# Patient Record
Sex: Female | Born: 1980 | Race: White | Hispanic: No | Marital: Married | Smoking: Current every day smoker
Health system: Southern US, Community
[De-identification: ages and names within clinical notes are randomized; demographics above are authoritative.]

## PROBLEM LIST (undated history)

## (undated) ENCOUNTER — Inpatient Hospital Stay (HOSPITAL_COMMUNITY): Payer: Self-pay

## (undated) DIAGNOSIS — Z1589 Genetic susceptibility to other disease: Secondary | ICD-10-CM

## (undated) DIAGNOSIS — E7212 Methylenetetrahydrofolate reductase deficiency: Secondary | ICD-10-CM

## (undated) DIAGNOSIS — Z8759 Personal history of other complications of pregnancy, childbirth and the puerperium: Secondary | ICD-10-CM

## (undated) DIAGNOSIS — O99280 Endocrine, nutritional and metabolic diseases complicating pregnancy, unspecified trimester: Secondary | ICD-10-CM

## (undated) HISTORY — PX: UTERINE SEPTUM RESECTION: SHX5386

---

## 2010-07-22 ENCOUNTER — Ambulatory Visit (HOSPITAL_COMMUNITY): Admission: RE | Admit: 2010-07-22 | Discharge: 2010-07-22 | Payer: Self-pay | Admitting: Obstetrics & Gynecology

## 2010-07-24 ENCOUNTER — Inpatient Hospital Stay (HOSPITAL_COMMUNITY): Admission: AD | Admit: 2010-07-24 | Discharge: 2010-07-24 | Payer: Self-pay | Admitting: Obstetrics and Gynecology

## 2010-07-28 DEATH — deceased

## 2010-09-27 HISTORY — PX: DILATION AND CURETTAGE OF UTERUS: SHX78

## 2010-12-09 LAB — CBC
HCT: 35.5 % — ABNORMAL LOW (ref 36.0–46.0)
MCV: 94.3 fL (ref 78.0–100.0)
RDW: 12.5 % (ref 11.5–15.5)
WBC: 8.3 10*3/uL (ref 4.0–10.5)

## 2011-04-01 LAB — OB RESULTS CONSOLE ANTIBODY SCREEN: Antibody Screen: NEGATIVE

## 2011-09-01 LAB — OB RESULTS CONSOLE ABO/RH

## 2011-09-28 NOTE — L&D Delivery Note (Signed)
Operative Delivery Note At 4:15 PM a viable and healthy female was delivered via Vaginal, Vacuum Investment banker, operational).  Presentation: vertex; Position: Right,, Occiput,, Anterior; Station: +3.  Verbal consent: obtained from patient.  Risks and benefits discussed in detail.  Risks include, but are not limited to the risks of anesthesia, bleeding, infection, damage to maternal tissues, fetal cephalhematoma.  There is also the risk of inability to effect vaginal delivery of the head, or shoulder dystocia that cannot be resolved by established maneuvers, leading to the need for emergency cesarean section. 2nd to terminal bradycardia( FHR 70's)  APGAR: 6, 8; weight 8 lb 5.5 oz (3785 g).   Placenta status: Intact, Spontaneous.   Cord: 3 vessels with the following complications: None. Cord around body.  Cord pH: none Anesthesia: None  Instruments:Kiwi , mushroom. Pop off x 1 Episiotomy: None Lacerations: 2nd degree Suture Repair: 3.0 chromic Est. Blood Loss (mL): 300  Mom to postpartum.  Baby to nursery-stable.  Riverlyn Kizziah A 04/14/2012, 6:16 PM

## 2011-10-29 ENCOUNTER — Other Ambulatory Visit: Payer: Self-pay

## 2011-11-10 ENCOUNTER — Other Ambulatory Visit (HOSPITAL_COMMUNITY): Payer: Self-pay | Admitting: Obstetrics and Gynecology

## 2011-11-10 DIAGNOSIS — O280 Abnormal hematological finding on antenatal screening of mother: Secondary | ICD-10-CM

## 2011-11-16 ENCOUNTER — Ambulatory Visit (HOSPITAL_COMMUNITY)
Admission: RE | Admit: 2011-11-16 | Discharge: 2011-11-16 | Disposition: A | Discharge status: Home / Self Care | Payer: BC Managed Care – PPO | Source: Ambulatory Visit | Attending: Obstetrics and Gynecology | Admitting: Obstetrics and Gynecology

## 2011-11-16 ENCOUNTER — Encounter (HOSPITAL_COMMUNITY): Payer: Self-pay

## 2011-11-16 DIAGNOSIS — O358XX0 Maternal care for other (suspected) fetal abnormality and damage, not applicable or unspecified: Secondary | ICD-10-CM | POA: Insufficient documentation

## 2011-11-16 DIAGNOSIS — Z1389 Encounter for screening for other disorder: Secondary | ICD-10-CM | POA: Insufficient documentation

## 2011-11-16 DIAGNOSIS — O280 Abnormal hematological finding on antenatal screening of mother: Secondary | ICD-10-CM

## 2011-11-16 DIAGNOSIS — O99330 Smoking (tobacco) complicating pregnancy, unspecified trimester: Secondary | ICD-10-CM | POA: Insufficient documentation

## 2011-11-16 DIAGNOSIS — Z363 Encounter for antenatal screening for malformations: Secondary | ICD-10-CM | POA: Insufficient documentation

## 2011-11-16 DIAGNOSIS — O289 Unspecified abnormal findings on antenatal screening of mother: Secondary | ICD-10-CM | POA: Insufficient documentation

## 2011-11-16 HISTORY — DX: Methylenetetrahydrofolate reductase deficiency: E72.12

## 2011-11-16 HISTORY — DX: Genetic susceptibility to other disease: Z15.89

## 2011-11-16 NOTE — Progress Notes (Signed)
Genetic Counseling  High-Risk Gestation Note  Appointment Date:  11/16/2011 Referred By: Valerie Conley, * Date of Birth:  1981-01-01 Partner: Valerie Conley, Jr.     Pregnancy History: G3P0020 Estimated Date of Delivery: 04/14/12 Estimated Gestational Age: [redacted]w[redacted]d Attending: Particia Nearing, MD   Valerie Conley and her husband, Mr. Valerie Conley. Conley, were seen for genetic counseling because of an increased risk for fetal Down syndrome based on Quad screening performed through [facility].  They were counseled regarding the Quad screen result and the associated 1 in 205 risk for fetal Down syndrome.  We reviewed chromosomes, nondisjunction, and the common features and variable prognosis of Down syndrome.  In addition, we reviewed the screen negative risks for trisomy 18 and ONTDs.  We also discussed other explanations for a screen positive result including: differences in maternal metabolism and normal variation.  We reviewed other available screening and diagnostic options including detailed ultrasound and amniocentesis.  We discussed the risks, limitations, and benefits of each. We discussed another type of screening test, noninvasive prenatal testing (NIPT), which utilizes cell free fetal DNA found in the maternal circulation. This test is not diagnostic for chromosome conditions, but can provide information regarding the presence or absence of extra fetal DNA for chromosomes 13, 18 and 21. Thus, it would not identify or rule out all fetal aneuploidy. The reported detection rate is greater than 99% for Trisomy 21, greater than 97% for Trisomy 18, and is approximately 80% (8 out of 10) for Trisomy 13. The false positive rate is thought to be less than 1% for any of these conditions.   After thoughtful consideration of these options, Valerie Conley elected to have ultrasound, but declined amniocentesis and cell free fetal DNA testing.  They understand that ultrasound cannot rule out all birth defects or  genetic syndromes.  The patient was advised of this limitation and states she still does not want diagnostic testing at this time or in the future given the associated risk of complications.   However, they were counseled that 50-80% of fetuses with Down syndrome, when well visualized, have detectable anomalies or soft markers by ultrasound.  A complete ultrasound was performed today.  The ultrasound report will be sent under separate cover.   Valerie Conley was provided with written information regarding cystic fibrosis (CF) including the carrier frequency and incidence in the Caucasian population, the availability of carrier testing and prenatal diagnosis if indicated.  In addition, we discussed that CF is routinely screened for as part of the [place] newborn screening panel.  She declined testing today.   Both family histories were reviewed and found to be contributory for the patient's sister with a history of Addison disease and seizures, diagnosed at age 57 years. She is reportedly legally blind and is otherwise healthy at age 83 years old. We discussed that without additional information regarding an underlying cause, recurrence risk for relatives cannot be accurately assessed. The family history was noncontributory for birth defects, mental retardation, and known genetic conditions. Without further information regarding the provided family history, an accurate genetic risk cannot be calculated. Further genetic counseling is warranted if more information is obtained.  Valerie Conley denied exposure to environmental toxins or chemical agents. She denied the use of street drugs. She reported smoking approximately 2 cigarettes per day. The associations of smoking in pregnancy were reviewed and cessation encouraged. She reported drinking alcohol one night before knowing about the pregnancy. The all-or-none period was discussed, meaning exposures that occur  in the first 4 weeks of gestation are typically  thought to either not affect the pregnancy at all or result in a miscarriage. She denied significant viral illnesses during the course of her pregnancy. Her medical and surgical histories were contributory for uterine septum surgical repair.   I counseled this couple for approximately 35 minutes regarding the above risks and available options.     Quinn Plowman, MS,  Certified Genetic Counselor 11/16/2011

## 2012-02-27 ENCOUNTER — Inpatient Hospital Stay (HOSPITAL_COMMUNITY)
Admission: AD | Admit: 2012-02-27 | Discharge: 2012-02-27 | Disposition: A | Discharge status: Home / Self Care | Payer: BC Managed Care – PPO | Source: Ambulatory Visit | Attending: Obstetrics & Gynecology | Admitting: Obstetrics & Gynecology

## 2012-02-27 ENCOUNTER — Encounter (HOSPITAL_COMMUNITY): Payer: Self-pay | Admitting: Obstetrics and Gynecology

## 2012-02-27 DIAGNOSIS — O99891 Other specified diseases and conditions complicating pregnancy: Secondary | ICD-10-CM | POA: Insufficient documentation

## 2012-02-27 DIAGNOSIS — O228X9 Other venous complications in pregnancy, unspecified trimester: Secondary | ICD-10-CM | POA: Insufficient documentation

## 2012-02-27 DIAGNOSIS — K5289 Other specified noninfective gastroenteritis and colitis: Secondary | ICD-10-CM | POA: Insufficient documentation

## 2012-02-27 DIAGNOSIS — K649 Unspecified hemorrhoids: Secondary | ICD-10-CM | POA: Insufficient documentation

## 2012-02-27 DIAGNOSIS — O212 Late vomiting of pregnancy: Secondary | ICD-10-CM | POA: Insufficient documentation

## 2012-02-27 DIAGNOSIS — R197 Diarrhea, unspecified: Secondary | ICD-10-CM | POA: Insufficient documentation

## 2012-02-27 HISTORY — DX: Personal history of other complications of pregnancy, childbirth and the puerperium: Z87.59

## 2012-02-27 HISTORY — DX: Methylenetetrahydrofolate reductase deficiency: O99.280

## 2012-02-27 HISTORY — DX: Methylenetetrahydrofolate reductase deficiency: E72.12

## 2012-02-27 LAB — URINALYSIS, ROUTINE W REFLEX MICROSCOPIC
Glucose, UA: NEGATIVE mg/dL
Hgb urine dipstick: NEGATIVE
Protein, ur: NEGATIVE mg/dL
pH: 6 (ref 5.0–8.0)

## 2012-02-27 MED ORDER — LOPERAMIDE HCL 2 MG PO CAPS
4.0000 mg | ORAL_CAPSULE | ORAL | Status: DC | PRN
  Filled 2012-02-27: qty 2

## 2012-02-27 MED ORDER — ONDANSETRON HCL 4 MG/2ML IJ SOLN: 4.0000 mg | Freq: Once | INTRAMUSCULAR | Status: DC

## 2012-02-27 MED ORDER — DEXTROSE IN LACTATED RINGERS 5 % IV SOLN
INTRAVENOUS | Status: DC
  Administered 2012-02-27: 999 mL/h via INTRAVENOUS
  Administered 2012-02-27: 500 mL/h via INTRAVENOUS

## 2012-02-27 MED ORDER — ONDANSETRON 8 MG/NS 50 ML IVPB
8.0000 mg | Freq: Once | INTRAVENOUS | Status: AC
  Administered 2012-02-27: 8 mg via INTRAVENOUS
  Filled 2012-02-27: qty 8

## 2012-02-27 MED ORDER — FAMOTIDINE IN NACL 20-0.9 MG/50ML-% IV SOLN
20.0000 mg | Freq: Once | INTRAVENOUS | Status: AC
  Administered 2012-02-27: 20 mg via INTRAVENOUS
  Filled 2012-02-27: qty 50

## 2012-02-27 NOTE — Discharge Instructions (Signed)
If you get home and are unable to hold down anything, having continuously vomiting or continuing to have diarrhea, please call [facility] OB[facility]GYN office.

## 2012-02-27 NOTE — MAU Note (Signed)
"  My nephew has a stomach bug and threw up on me Friday at lunch.  Everything started at about 2330 last night (vomiting & diarrhea).  The last time I threw up was about 0230 this morning; which is the last time I've tried to drink anything.  I had some ice chips on the way down here and it made my stomach burn.  The baby seems to be moving really good though.  The diarrhea still continues and I am still nauseated."

## 2012-02-27 NOTE — MAU Provider Note (Signed)
  History   CSN: [id number] Arrival date and time: 02/27/12 6578   Chief Complaint  Patient presents with  . Emesis During Pregnancy  . Diarrhea   HPI G3P0, at 33.2/7 wks, here for acute gastroenteritis after sick contact with her nephew few days back (his parents got sick a day after him). C/o vomiting and diarrhea since yesterday and not able to keep any fluids/food down, feels weak, tired.  Has hemorrhoids hx, not prolapsed right now.  Good FMs, no contractions/ bleeding/ loss of fluid or any Ob complaints.  Good PNCare (sees Dr Cherly Hensen)   Past Medical History  Diagnosis Date  . MTHFR mutation   . MTHFR deficiency complicating pregnancy   . History of miscarriage    Past Surgical History  Procedure Date  . Dilation and curettage of uterus 2012    with 2nd SAB   Family History  Problem Relation Age of Onset  . Anesthesia problems Neg Hx   . Hypotension Neg Hx   . Malignant hyperthermia Neg Hx   . Pseudochol deficiency Neg Hx    History  Substance Use Topics  . Smoking status: Former Smoker    Types: Cigarettes    Quit date: 07/30/2011  . Smokeless tobacco: Not on file  . Alcohol Use: No   Allergies:  Allergies  Allergen Reactions  . Penicillins Nausea And Vomiting and Rash   Prescriptions prior to admission  Medication Sig Dispense Refill  . calcium carbonate (TUMS - DOSED IN MG ELEMENTAL CALCIUM) 500 MG chewable tablet Chew 3 tablets by mouth daily. Takes for heartburn      . Cyanocobalamin (VITAMIN B-12 PO) Take 1 tablet by mouth daily.       . folic acid (FOLVITE) 1 MG tablet Take 4 mg by mouth daily.      . Prenatal Vit-Fe Fumarate-FA (PRENATAL MULTIVITAMIN) TABS Take 1 tablet by mouth every morning.      . pyridOXINE (B-6) 50 MG tablet Take 25 mg by mouth 3 (three) times daily.       ROS  Denies chest pain/SOB/ dizziness/ HAs. Physical Exam   Blood pressure 107/54, pulse 89, temperature 97.6 F (36.4 C), temperature source Oral, resp. rate 18, height 5'  7.5" (1.715 m), weight 85.639 kg (188 lb 12.8 oz), last menstrual period 07/09/2011.  Physical Exam A&O x 3, no acute distress, but appears tired. Pleasant Lungs CTA bilat CV RRR, no tachycardia Abdo soft, non tender, non acute, relaxed. No palpable contractions Extr no edema/ tenderness Pelvic deferred since no contractions or pressure or bleeding FHT 155/ + accels/ no decels/ moderate variab- NST reactive Toco none  MAU Course  Procedures IV D5LR 1 lit Zofran 8 mg IV Famotidine 20 mg IV  Assessment and Plan  Acute gastroenteritis, likely viral from sick contract IVFluids. PO tolerated without further vomiting.  BRAT diet reviewed Hemorrhoids mngmt F/up office in 1 wk, sooner if not better, PTL precautions, FAC.  Allanna Bresee R 02/27/2012, 12:40 PM

## 2012-03-15 LAB — OB RESULTS CONSOLE GBS: GBS: NEGATIVE

## 2012-04-14 ENCOUNTER — Inpatient Hospital Stay (HOSPITAL_COMMUNITY)
Admission: AD | Admit: 2012-04-14 | Discharge: 2012-04-16 | DRG: 373 | Disposition: A | Discharge status: Home / Self Care | Payer: BC Managed Care – PPO | Source: Ambulatory Visit | Attending: Obstetrics and Gynecology | Admitting: Obstetrics and Gynecology

## 2012-04-14 ENCOUNTER — Encounter (HOSPITAL_COMMUNITY): Payer: Self-pay | Admitting: *Deleted

## 2012-04-14 DIAGNOSIS — O878 Other venous complications in the puerperium: Secondary | ICD-10-CM | POA: Diagnosis present

## 2012-04-14 DIAGNOSIS — O09529 Supervision of elderly multigravida, unspecified trimester: Secondary | ICD-10-CM | POA: Diagnosis present

## 2012-04-14 DIAGNOSIS — K649 Unspecified hemorrhoids: Secondary | ICD-10-CM | POA: Diagnosis present

## 2012-04-14 LAB — TYPE AND SCREEN
ABO/RH(D): A POS
Antibody Screen: NEGATIVE

## 2012-04-14 LAB — CBC
Hemoglobin: 13.3 g/dL (ref 12.0–15.0)
MCHC: 34.9 g/dL (ref 30.0–36.0)
RDW: 12.9 % (ref 11.5–15.5)

## 2012-04-14 LAB — RPR: RPR Ser Ql: NONREACTIVE

## 2012-04-14 MED ORDER — IBUPROFEN 600 MG PO TABS
600.0000 mg | ORAL_TABLET | Freq: Four times a day (QID) | ORAL | Status: DC
  Administered 2012-04-15 – 2012-04-16 (×7): 600 mg via ORAL
  Filled 2012-04-14 (×6): qty 1

## 2012-04-14 MED ORDER — IBUPROFEN 600 MG PO TABS: 600.0000 mg | ORAL_TABLET | Freq: Four times a day (QID) | ORAL | Status: DC | PRN

## 2012-04-14 MED ORDER — BENZOCAINE-MENTHOL 20-0.5 % EX AERO
1.0000 "application " | INHALATION_SPRAY | CUTANEOUS | Status: DC | PRN
  Filled 2012-04-14: qty 56

## 2012-04-14 MED ORDER — ONDANSETRON HCL 4 MG PO TABS: 4.0000 mg | ORAL_TABLET | ORAL | Status: DC | PRN

## 2012-04-14 MED ORDER — PHENYLEPHRINE 40 MCG/ML (10ML) SYRINGE FOR IV PUSH (FOR BLOOD PRESSURE SUPPORT)
80.0000 ug | PREFILLED_SYRINGE | INTRAVENOUS | Status: DC | PRN
  Filled 2012-04-14: qty 5

## 2012-04-14 MED ORDER — FERROUS SULFATE 325 (65 FE) MG PO TABS
325.0000 mg | ORAL_TABLET | Freq: Two times a day (BID) | ORAL | Status: DC
  Administered 2012-04-15: 325 mg via ORAL
  Filled 2012-04-14: qty 1

## 2012-04-14 MED ORDER — LACTATED RINGERS IV SOLN: 500.0000 mL | INTRAVENOUS | Status: DC | PRN

## 2012-04-14 MED ORDER — TERBUTALINE SULFATE 1 MG/ML IJ SOLN: 0.2500 mg | Freq: Once | INTRAMUSCULAR | Status: DC | PRN

## 2012-04-14 MED ORDER — ONDANSETRON HCL 4 MG/2ML IJ SOLN: 4.0000 mg | INTRAMUSCULAR | Status: DC | PRN

## 2012-04-14 MED ORDER — OXYCODONE-ACETAMINOPHEN 5-325 MG PO TABS: 1.0000 | ORAL_TABLET | ORAL | Status: DC | PRN

## 2012-04-14 MED ORDER — TETANUS-DIPHTH-ACELL PERTUSSIS 5-2.5-18.5 LF-MCG/0.5 IM SUSP: 0.5000 mL | Freq: Once | INTRAMUSCULAR | Status: DC

## 2012-04-14 MED ORDER — EPHEDRINE 5 MG/ML INJ: 10.0000 mg | INTRAVENOUS | Status: DC | PRN

## 2012-04-14 MED ORDER — FENTANYL 2.5 MCG/ML BUPIVACAINE 1/10 % EPIDURAL INFUSION (WH - ANES)
14.0000 mL/h | INTRAMUSCULAR | Status: DC
  Filled 2012-04-14: qty 60

## 2012-04-14 MED ORDER — LACTATED RINGERS IV SOLN
INTRAVENOUS | Status: DC
  Administered 2012-04-14: 125 mL/h via INTRAVENOUS
  Administered 2012-04-14: 06:00:00 via INTRAVENOUS

## 2012-04-14 MED ORDER — LACTATED RINGERS IV SOLN: 500.0000 mL | Freq: Once | INTRAVENOUS | Status: DC

## 2012-04-14 MED ORDER — NALBUPHINE SYRINGE 5 MG/0.5 ML
10.0000 mg | INJECTION | INTRAMUSCULAR | Status: DC | PRN
  Administered 2012-04-14 (×2): 10 mg via INTRAVENOUS
  Filled 2012-04-14: qty 1
  Filled 2012-04-14 (×2): qty 0.5

## 2012-04-14 MED ORDER — WITCH HAZEL-GLYCERIN EX PADS
1.0000 "application " | MEDICATED_PAD | CUTANEOUS | Status: DC | PRN
  Administered 2012-04-15: 1 via TOPICAL

## 2012-04-14 MED ORDER — ACETAMINOPHEN 325 MG PO TABS: 650.0000 mg | ORAL_TABLET | ORAL | Status: DC | PRN

## 2012-04-14 MED ORDER — DIPHENHYDRAMINE HCL 25 MG PO CAPS: 25.0000 mg | ORAL_CAPSULE | Freq: Four times a day (QID) | ORAL | Status: DC | PRN

## 2012-04-14 MED ORDER — EPHEDRINE 5 MG/ML INJ
10.0000 mg | INTRAVENOUS | Status: DC | PRN
  Filled 2012-04-14: qty 4

## 2012-04-14 MED ORDER — CITRIC ACID-SODIUM CITRATE 334-500 MG/5ML PO SOLN: 30.0000 mL | ORAL | Status: DC | PRN

## 2012-04-14 MED ORDER — FLEET ENEMA 7-19 GM/118ML RE ENEM: 1.0000 | ENEMA | RECTAL | Status: DC | PRN

## 2012-04-14 MED ORDER — SIMETHICONE 80 MG PO CHEW: 80.0000 mg | CHEWABLE_TABLET | ORAL | Status: DC | PRN

## 2012-04-14 MED ORDER — OXYTOCIN 40 UNITS IN LACTATED RINGERS INFUSION - SIMPLE MED
1.0000 m[IU]/min | INTRAVENOUS | Status: DC
  Administered 2012-04-14: 2 m[IU]/min via INTRAVENOUS

## 2012-04-14 MED ORDER — LIDOCAINE HCL (PF) 1 % IJ SOLN
30.0000 mL | INTRAMUSCULAR | Status: DC | PRN
  Administered 2012-04-14: 30 mL via SUBCUTANEOUS
  Filled 2012-04-14: qty 30

## 2012-04-14 MED ORDER — LANOLIN HYDROUS EX OINT: TOPICAL_OINTMENT | CUTANEOUS | Status: DC | PRN

## 2012-04-14 MED ORDER — DIPHENHYDRAMINE HCL 50 MG/ML IJ SOLN: 12.5000 mg | INTRAMUSCULAR | Status: DC | PRN

## 2012-04-14 MED ORDER — ZOLPIDEM TARTRATE 5 MG PO TABS: 5.0000 mg | ORAL_TABLET | Freq: Every evening | ORAL | Status: DC | PRN

## 2012-04-14 MED ORDER — ONDANSETRON HCL 4 MG/2ML IJ SOLN: 4.0000 mg | Freq: Four times a day (QID) | INTRAMUSCULAR | Status: DC | PRN

## 2012-04-14 MED ORDER — PHENYLEPHRINE 40 MCG/ML (10ML) SYRINGE FOR IV PUSH (FOR BLOOD PRESSURE SUPPORT): 80.0000 ug | PREFILLED_SYRINGE | INTRAVENOUS | Status: DC | PRN

## 2012-04-14 MED ORDER — OXYTOCIN 40 UNITS IN LACTATED RINGERS INFUSION - SIMPLE MED
62.5000 mL/h | Freq: Once | INTRAVENOUS | Status: AC
  Administered 2012-04-14: 62.5 mL/h via INTRAVENOUS
  Filled 2012-04-14: qty 1000

## 2012-04-14 MED ORDER — DIBUCAINE 1 % RE OINT
1.0000 "application " | TOPICAL_OINTMENT | RECTAL | Status: DC | PRN
  Administered 2012-04-15: 1 via RECTAL
  Filled 2012-04-14: qty 28

## 2012-04-14 MED ORDER — PRENATAL MULTIVITAMIN CH
1.0000 | ORAL_TABLET | Freq: Every day | ORAL | Status: DC
  Administered 2012-04-15 – 2012-04-16 (×2): 1 via ORAL
  Filled 2012-04-14 (×2): qty 1

## 2012-04-14 MED ORDER — SENNOSIDES-DOCUSATE SODIUM 8.6-50 MG PO TABS
2.0000 | ORAL_TABLET | Freq: Every day | ORAL | Status: DC
  Administered 2012-04-15: 2 via ORAL

## 2012-04-14 MED ORDER — OXYTOCIN BOLUS FROM INFUSION
250.0000 mL | Freq: Once | INTRAVENOUS | Status: DC
  Filled 2012-04-14: qty 500

## 2012-04-14 NOTE — Progress Notes (Signed)
Discussed pain med options, pt verbalized understanding and will ask for pain med if needed.

## 2012-04-14 NOTE — Progress Notes (Signed)
Notified of patient, ctx pattern, srom, sve result. Order to admit l/d unit.

## 2012-04-14 NOTE — H&P (Signed)
CC: SROM  HPI 31 yo [id number] MWF at term gestation presented for labor check and had spontaneous ROM clear fluid. PNC notable for increased DS risk . Pt saw MFM and declined amnio. (+) infertility pt  PMH: NKDA Med: Vit B12, Vit B6, folic acid Medical: MHTFR deficiency Surg: neg FH: noncontiributory OB: SAB x 2  SH. Married nonsmoker  ROS neg  PNC: WOB/Sherley Leser. Blood type A+ AB neg, rubella Immune. HbSAg neg, RPR NR. GBS cx neg. AFP4 abnl( increased DS risk), 1hr GCT nl. HIV neg  PE: uncomfortable white female  VS: BP 125/67. T98.2 RR18 Skin: no lesion Heent: anicteric sclera, pink conjunctiva Oropharynx neg  COr RRR Lungs clear to A Breast soft nontender no palp mass Abd: gravid mild palp ctx Pelvic: 4/80/-2 Extr (-)edema  Tracing reassuring ctx q 4-5 mins  IMP: active labor Term gestation P) admit:  Pitocin augmentation. Routine labs. Analgesic prn

## 2012-04-14 NOTE — MAU Note (Signed)
PT ARRIVED - REMOVED FROM LOBBY WITH W/C.  SAYS SROM AT 0600.  THINKS WAS TRICKLING AT HOME..   DENIES HSV AND MRSA.

## 2012-04-14 NOTE — Progress Notes (Signed)
Pt standing by sink, leaning over, monitors readjusted several times, pt breathing through uc's, pt back to bed, monitors readjusted.

## 2012-04-15 LAB — CBC
Hemoglobin: 11 g/dL — ABNORMAL LOW (ref 12.0–15.0)
MCH: 31.5 pg (ref 26.0–34.0)
MCV: 91.7 fL (ref 78.0–100.0)
Platelets: 182 10*3/uL (ref 150–400)
RBC: 3.49 MIL/uL — ABNORMAL LOW (ref 3.87–5.11)
WBC: 18.4 10*3/uL — ABNORMAL HIGH (ref 4.0–10.5)

## 2012-04-15 MED ORDER — PYRIDOXINE HCL 25 MG PO TABS
25.0000 mg | ORAL_TABLET | Freq: Three times a day (TID) | ORAL | Status: DC
Start: 2012-04-15 — End: 2012-04-16
  Administered 2012-04-15: 25 mg via ORAL
  Administered 2012-04-15: 22:00:00 via ORAL
  Administered 2012-04-15 – 2012-04-16 (×3): 25 mg via ORAL
  Filled 2012-04-15 (×5): qty 1

## 2012-04-15 MED ORDER — HYDROCORTISONE 2.5 % RE CREA
TOPICAL_CREAM | Freq: Three times a day (TID) | RECTAL | Status: DC
  Administered 2012-04-15 – 2012-04-16 (×3): via RECTAL
  Filled 2012-04-15: qty 28.35

## 2012-04-15 MED ORDER — FOLIC ACID 1 MG PO TABS
4.0000 mg | ORAL_TABLET | Freq: Every day | ORAL | Status: DC
  Filled 2012-04-15: qty 4

## 2012-04-15 MED ORDER — FOLIC ACID 1 MG PO TABS
4.0000 mg | ORAL_TABLET | Freq: Every day | ORAL | Status: DC
  Administered 2012-04-15 – 2012-04-16 (×2): 4 mg via ORAL
  Filled 2012-04-15 (×2): qty 4

## 2012-04-15 MED ORDER — CYANOCOBALAMIN 500 MCG PO TABS
500.0000 ug | ORAL_TABLET | Freq: Every day | ORAL | Status: DC
  Administered 2012-04-15: 22:00:00 via ORAL
  Administered 2012-04-16: 500 ug via ORAL
  Filled 2012-04-15 (×2): qty 1

## 2012-04-15 MED ORDER — FOLIC ACID 1 MG PO TABS
1.0000 mg | ORAL_TABLET | Freq: Every day | ORAL | Status: DC
  Filled 2012-04-15: qty 1

## 2012-04-15 NOTE — Progress Notes (Signed)
PPD 1 VAVD  S:  Dr Wonda Olds in this am to examine / visit             Reports feeling well - bottom swollen and sore              Tolerating po/ No nausea or vomiting             Bleeding is light             Pain controlled with motrin and percocet             Up ad lib / ambulatory  Newborn breast feeding     O:  A & O x 3 NAD             VS: Blood pressure 125/76, pulse 57, temperature 97.8 F (36.6 C), temperature source Oral, resp. rate 18, height 5\' 7"  (1.702 m), weight 87.091 kg (192 lb), last menstrual period 07/09/2011, SpO2 99.00%, unknown if currently breastfeeding.  LABS: WBC/Hgb/Hct/Plts:  18.4/11.0/32.0/182 (07/20 0520)   Lungs: Clear and unlabored  Heart: regular rate and rhythm   Abdomen: soft, non-tender, non-distended              Fundus: firm, non-tender, U-1  Perineum: Moderate edema / ice pack in place /small inflamed hemorrhoid-pink  Lochia: light  Extremities: trace edema, no calf pain or tenderness    A: PPD # 1 VAVD   Doing well - stable status  P:  Routine post partum orders  Start hemorrhoidal medication             Ice packs and analgesia today             Plan discharge tomorrow  Marlinda Mike CNM, MSN 04/15/2012, 10:31 AM

## 2012-04-16 MED ORDER — IBUPROFEN 600 MG PO TABS: 600.0000 mg | ORAL_TABLET | Freq: Four times a day (QID) | ORAL | Status: AC

## 2012-04-16 MED ORDER — HYDROCORTISONE 2.5 % RE CREA: TOPICAL_CREAM | Freq: Three times a day (TID) | RECTAL | Status: AC

## 2012-04-16 MED ORDER — OXYCODONE-ACETAMINOPHEN 5-325 MG PO TABS: 1.0000 | ORAL_TABLET | ORAL | Status: AC | PRN

## 2012-04-16 NOTE — Discharge Summary (Signed)
Obstetric Discharge Summary  Reason for Admission: onset of labor with SROM Prenatal Procedures: ultrasound and MFM consult for increased risk for DS Intrapartum Procedures: vacuum Postpartum Procedures: none Complications-Operative and Postpartum: 2nd  degree perineal laceration Hemoglobin  Date Value Range Status  04/15/2012 11.0* 12.0 - 15.0 g/dL Final     DELTA CHECK NOTED     REPEATED TO VERIFY     HCT  Date Value Range Status  04/15/2012 32.0* 36.0 - 46.0 % Final    Physical Exam:  General: alert, cooperative, appears stated age and no distress Lochia: appropriate Uterine Fundus: firm Incision: healing well DVT Evaluation: No evidence of DVT seen on physical exam.  Discharge Diagnoses: Term Pregnancy-delivered  Discharge Information: Date: 04/16/2012 Activity: pelvic rest Diet: routine Medications: PNV, Ibuprofen, Percocet and Anusol-HC Condition: stable Instructions: refer to practice specific booklet Discharge to: home Follow-up Information    Follow up with Fabien Travelstead A, MD. Schedule an appointment as soon as possible for a visit in 6 weeks.   Contact information:   [address] [place] [address] [phone]          Newborn Data: Live born female  Birth Weight: 8 lb 5.5 oz (3785 g) APGAR: 6, 8  Home with mother.  Valerie Conley 04/16/2012, 9:56 AM

## 2012-04-16 NOTE — Progress Notes (Signed)
PPD 2 SVD  S:  Reports feeling better today             Tolerating po/ No nausea or vomiting             Bleeding is light             Pain controlled with motrin and percocet             Up ad lib / ambulatory  Newborn breast feeding  / Circumcision done   O:  A & O x 3 NAD             VS: Blood pressure 100/63, pulse 58, temperature 98.6 F (37 C), temperature source Oral, resp. rate 18, height 5\' 7"  (1.702 m), weight 87.091 kg (192 lb), last menstrual period 07/09/2011, SpO2 99.00%, unknown if currently breastfeeding.  Lungs: Clear and unlabored  Heart: regular rate and rhythm   Abdomen: soft, non-tender, non-distended              Fundus: firm, non-tender, U-2  Perineum: decreased edema / small hemorrhoid - no inflammation today ( using dibucaine and Anusol)  Lochia: light  Extremities: no edema, no calf pain or tenderness    A: PPD # 2   Doing well - stable status  P:  Routine post partum orders  Discharge to home             WOB booklet - instructions reveiwed  Marlinda Mike CNM, MSN 04/16/2012, 9:52 AM

## 2012-09-27 NOTE — L&D Delivery Note (Signed)
Delivery Note At 10:36 AM a viable and healthy female was delivered via Vaginal, Spontaneous Delivery (Presentation: ; Occiput Anterior).  APGAR: 8, 9; weight pending Placenta status: Intact, Spontaneous. Not sent  Cord:  CAN x 1  Reducible. 3 vessels with the following complications: None Long.  Cord pH: none  Anesthesia: None  Episiotomy: None Lacerations: None Suture Repair: n/a Est. Blood Loss (mL): 250  Mom to postpartum.  Baby to Couplet care / Skin to Skin.  Carry Weesner A 09/23/2013, 11:12 AM

## 2013-02-22 LAB — OB RESULTS CONSOLE GC/CHLAMYDIA: Gonorrhea: NEGATIVE

## 2013-02-22 LAB — OB RESULTS CONSOLE GBS: GBS: NEGATIVE

## 2013-07-03 LAB — OB RESULTS CONSOLE RPR: RPR: NONREACTIVE

## 2013-08-17 ENCOUNTER — Encounter (HOSPITAL_COMMUNITY): Payer: Self-pay | Admitting: *Deleted

## 2013-08-17 ENCOUNTER — Inpatient Hospital Stay (HOSPITAL_COMMUNITY)
Admission: AD | Admit: 2013-08-17 | Discharge: 2013-08-18 | Disposition: A | Discharge status: Home / Self Care | Payer: BC Managed Care – PPO | Source: Ambulatory Visit | Attending: Obstetrics and Gynecology | Admitting: Obstetrics and Gynecology

## 2013-08-17 DIAGNOSIS — O9989 Other specified diseases and conditions complicating pregnancy, childbirth and the puerperium: Secondary | ICD-10-CM | POA: Insufficient documentation

## 2013-08-17 DIAGNOSIS — R21 Rash and other nonspecific skin eruption: Secondary | ICD-10-CM | POA: Insufficient documentation

## 2013-08-17 DIAGNOSIS — L299 Pruritus, unspecified: Secondary | ICD-10-CM | POA: Insufficient documentation

## 2013-08-17 LAB — URINALYSIS, ROUTINE W REFLEX MICROSCOPIC
Bilirubin Urine: NEGATIVE
Nitrite: NEGATIVE
Specific Gravity, Urine: 1.025 (ref 1.005–1.030)
Urobilinogen, UA: 0.2 mg/dL (ref 0.0–1.0)
pH: 6 (ref 5.0–8.0)

## 2013-08-17 LAB — URINE MICROSCOPIC-ADD ON

## 2013-08-17 MED ORDER — HYDROCORTISONE 1 % EX CREA
TOPICAL_CREAM | Freq: Two times a day (BID) | CUTANEOUS | Status: DC
  Administered 2013-08-17: 22:00:00 via TOPICAL
  Filled 2013-08-17: qty 28

## 2013-08-17 MED ORDER — DIPHENHYDRAMINE HCL 25 MG PO CAPS
25.0000 mg | ORAL_CAPSULE | Freq: Once | ORAL | Status: AC
  Administered 2013-08-17: 25 mg via ORAL
  Filled 2013-08-17: qty 1

## 2013-08-17 NOTE — MAU Note (Signed)
Patient presents to MAu with c/o nausea and generalized red rash. Denies any new use of detergents/soaps nor change in diet.

## 2013-08-18 NOTE — MAU Provider Note (Signed)
  History   Itching and rash  CSN: [id number]  Arrival date and time: 08/17/13 2020   First Provider Initiated Contact with Patient 08/17/13 2138      Chief Complaint  Patient presents with  . Rash  . Nausea   HPI Presented with sudden onset rash. Good FM. Occ ctxs.  OB History   Grav Para Term Preterm Abortions TAB SAB Ect Mult Living   4 1 1  0 2 0 2 0 0 1      Past Medical History  Diagnosis Date  . MTHFR mutation   . MTHFR deficiency complicating pregnancy   . History of miscarriage     Past Surgical History  Procedure Laterality Date  . Dilation and curettage of uterus  2012    with 2nd SAB    Family History  Problem Relation Age of Onset  . Anesthesia problems Neg Hx   . Hypotension Neg Hx   . Malignant hyperthermia Neg Hx   . Pseudochol deficiency Neg Hx     History  Substance Use Topics  . Smoking status: Former Smoker    Types: Cigarettes    Quit date: 07/30/2011  . Smokeless tobacco: Not on file  . Alcohol Use: No    Allergies:  Allergies  Allergen Reactions  . Penicillins Nausea And Vomiting and Rash    Prescriptions prior to admission  Medication Sig Dispense Refill  . calcium carbonate (TUMS - DOSED IN MG ELEMENTAL CALCIUM) 500 MG chewable tablet Chew 3 tablets by mouth daily. Takes for heartburn      . Cyanocobalamin (VITAMIN B-12 PO) Take 1 tablet by mouth daily.       . folic acid (FOLVITE) 1 MG tablet Take 4 mg by mouth daily.      . Prenatal Vit-Fe Fumarate-FA (PRENATAL MULTIVITAMIN) TABS Take 1 tablet by mouth every morning.      . pyridOXINE (B-6) 50 MG tablet Take 25 mg by mouth 3 (three) times daily.      Marland Kitchen VITAMIN D, ERGOCALCIFEROL, PO Take 1 tablet by mouth daily.        ROS Physical Exam HEENT : nl Neck : supple Lgs: cTA CV: RRR ABD: gravid and non tender No CVAT Ext: neg c/c/e VE : cl/lg/-2 Neuro: non focal SKin: intact  NST reactive Rare Contractions   Blood pressure 112/71, pulse 76, temperature 97.4 F  (36.3 C), temperature source Oral, resp. rate 18, height 5\' 7"  (1.702 m), weight 78.472 kg (173 lb), SpO2 100.00%.  Physical Exam  MAU Course  Procedures  MDM na  Assessment and Plan  34 weeks Pruritus with resolution DC home  Gavriella Hearst J 08/18/2013, 12:11 AM

## 2013-09-23 ENCOUNTER — Encounter (HOSPITAL_COMMUNITY): Payer: Self-pay | Admitting: *Deleted

## 2013-09-23 ENCOUNTER — Inpatient Hospital Stay (HOSPITAL_COMMUNITY)
Admission: AD | Admit: 2013-09-23 | Discharge: 2013-09-24 | DRG: 775 | Disposition: A | Discharge status: Home / Self Care | Payer: BC Managed Care – PPO | Source: Ambulatory Visit | Attending: Obstetrics and Gynecology | Admitting: Obstetrics and Gynecology

## 2013-09-23 DIAGNOSIS — Z87891 Personal history of nicotine dependence: Secondary | ICD-10-CM

## 2013-09-23 DIAGNOSIS — Z641 Problems related to multiparity: Secondary | ICD-10-CM

## 2013-09-23 DIAGNOSIS — Z3490 Encounter for supervision of normal pregnancy, unspecified, unspecified trimester: Secondary | ICD-10-CM

## 2013-09-23 LAB — CBC
MCV: 89.7 fL (ref 78.0–100.0)
Platelets: 176 10*3/uL (ref 150–400)
RBC: 4.26 MIL/uL (ref 3.87–5.11)
RDW: 12.5 % (ref 11.5–15.5)
WBC: 13.2 10*3/uL — ABNORMAL HIGH (ref 4.0–10.5)

## 2013-09-23 LAB — RPR: RPR Ser Ql: NONREACTIVE

## 2013-09-23 MED ORDER — IBUPROFEN 600 MG PO TABS
600.0000 mg | ORAL_TABLET | Freq: Four times a day (QID) | ORAL | Status: DC
  Administered 2013-09-24 (×3): 600 mg via ORAL
  Filled 2013-09-23 (×4): qty 1

## 2013-09-23 MED ORDER — ONDANSETRON HCL 4 MG PO TABS: 4.0000 mg | ORAL_TABLET | ORAL | Status: DC | PRN

## 2013-09-23 MED ORDER — ZOLPIDEM TARTRATE 5 MG PO TABS: 5.0000 mg | ORAL_TABLET | Freq: Every evening | ORAL | Status: DC | PRN

## 2013-09-23 MED ORDER — OXYTOCIN 10 UNIT/ML IJ SOLN: 10.0000 [IU] | Freq: Once | INTRAMUSCULAR | Status: DC

## 2013-09-23 MED ORDER — FLEET ENEMA 7-19 GM/118ML RE ENEM: 1.0000 | ENEMA | RECTAL | Status: DC | PRN

## 2013-09-23 MED ORDER — LANOLIN HYDROUS EX OINT: TOPICAL_OINTMENT | CUTANEOUS | Status: DC | PRN

## 2013-09-23 MED ORDER — METHYLERGONOVINE MALEATE 0.2 MG PO TABS: 0.2000 mg | ORAL_TABLET | ORAL | Status: DC | PRN

## 2013-09-23 MED ORDER — IBUPROFEN 600 MG PO TABS: 600.0000 mg | ORAL_TABLET | Freq: Four times a day (QID) | ORAL | Status: DC | PRN

## 2013-09-23 MED ORDER — PRENATAL MULTIVITAMIN CH
1.0000 | ORAL_TABLET | Freq: Every day | ORAL | Status: DC
  Filled 2013-09-23: qty 1

## 2013-09-23 MED ORDER — OXYCODONE-ACETAMINOPHEN 5-325 MG PO TABS: 1.0000 | ORAL_TABLET | ORAL | Status: DC | PRN

## 2013-09-23 MED ORDER — LIDOCAINE HCL (PF) 1 % IJ SOLN
INTRAMUSCULAR | Status: AC
  Filled 2013-09-23: qty 30

## 2013-09-23 MED ORDER — LACTATED RINGERS IV SOLN: 500.0000 mL | INTRAVENOUS | Status: DC | PRN

## 2013-09-23 MED ORDER — LACTATED RINGERS IV SOLN: INTRAVENOUS | Status: DC

## 2013-09-23 MED ORDER — OXYTOCIN 40 UNITS IN LACTATED RINGERS INFUSION - SIMPLE MED: 62.5000 mL/h | INTRAVENOUS | Status: DC

## 2013-09-23 MED ORDER — METHYLERGONOVINE MALEATE 0.2 MG/ML IJ SOLN: 0.2000 mg | INTRAMUSCULAR | Status: DC | PRN

## 2013-09-23 MED ORDER — SIMETHICONE 80 MG PO CHEW: 80.0000 mg | CHEWABLE_TABLET | ORAL | Status: DC | PRN

## 2013-09-23 MED ORDER — SENNOSIDES-DOCUSATE SODIUM 8.6-50 MG PO TABS
2.0000 | ORAL_TABLET | ORAL | Status: DC
  Administered 2013-09-24: 2 via ORAL
  Filled 2013-09-23: qty 2

## 2013-09-23 MED ORDER — CITRIC ACID-SODIUM CITRATE 334-500 MG/5ML PO SOLN: 30.0000 mL | ORAL | Status: DC | PRN

## 2013-09-23 MED ORDER — BENZOCAINE-MENTHOL 20-0.5 % EX AERO: 1.0000 "application " | INHALATION_SPRAY | CUTANEOUS | Status: DC | PRN

## 2013-09-23 MED ORDER — ONDANSETRON HCL 4 MG/2ML IJ SOLN: 4.0000 mg | Freq: Four times a day (QID) | INTRAMUSCULAR | Status: DC | PRN

## 2013-09-23 MED ORDER — WITCH HAZEL-GLYCERIN EX PADS: 1.0000 "application " | MEDICATED_PAD | CUTANEOUS | Status: DC | PRN

## 2013-09-23 MED ORDER — LACTATED RINGERS IV SOLN
INTRAVENOUS | Status: DC
  Administered 2013-09-23: 10:00:00 via INTRAVENOUS

## 2013-09-23 MED ORDER — ACETAMINOPHEN 325 MG PO TABS: 650.0000 mg | ORAL_TABLET | ORAL | Status: DC | PRN

## 2013-09-23 MED ORDER — FERROUS SULFATE 325 (65 FE) MG PO TABS: 325.0000 mg | ORAL_TABLET | Freq: Two times a day (BID) | ORAL | Status: DC

## 2013-09-23 MED ORDER — OXYTOCIN BOLUS FROM INFUSION
500.0000 mL | INTRAVENOUS | Status: DC
  Administered 2013-09-23: 500 mL via INTRAVENOUS

## 2013-09-23 MED ORDER — DIBUCAINE 1 % RE OINT: 1.0000 "application " | TOPICAL_OINTMENT | RECTAL | Status: DC | PRN

## 2013-09-23 MED ORDER — DIPHENHYDRAMINE HCL 25 MG PO CAPS: 25.0000 mg | ORAL_CAPSULE | Freq: Four times a day (QID) | ORAL | Status: DC | PRN

## 2013-09-23 MED ORDER — OXYTOCIN 40 UNITS IN LACTATED RINGERS INFUSION - SIMPLE MED
INTRAVENOUS | Status: AC
  Filled 2013-09-23: qty 1000

## 2013-09-23 MED ORDER — ONDANSETRON HCL 4 MG/2ML IJ SOLN: 4.0000 mg | INTRAMUSCULAR | Status: DC | PRN

## 2013-09-23 MED ORDER — LIDOCAINE HCL (PF) 1 % IJ SOLN
30.0000 mL | INTRAMUSCULAR | Status: DC | PRN
  Filled 2013-09-23: qty 30

## 2013-09-23 NOTE — Progress Notes (Signed)
Dr. Cherly Hensen returned call, notified of pt in MAU, cervix 7/90/-2 ruptured, will put in orders for pt to be admitted.

## 2013-09-23 NOTE — Progress Notes (Signed)
Ask to see patient for Dr Cherly Hensen -  Pt 7cm dilated in active labor with questionable presentation.  Bedside US done confirming vtx.  Levie Heritage, DO 09/23/2013 10:04 AM

## 2013-09-23 NOTE — Progress Notes (Signed)
Provider paged.

## 2013-09-23 NOTE — MAU Note (Signed)
Dr. Adrian Blackwater at bedside with ultrasound to check for presentation. Baby confirmed to be vertex

## 2013-09-23 NOTE — Progress Notes (Signed)
RDawson, CNM called, notified of pt in MAU, RN told to call Dr. Cherly Hensen

## 2013-09-23 NOTE — H&P (Signed)
Valerie Conley is a 32 y.o. female presenting for admission due to active labor. SROM clear fluid @ 7:35 am  GBS cx neg. Labor started around 5 am.  Maternal Medical History:  Reason for admission: Rupture of membranes and contractions.   Contractions: Onset was 3-5 hours ago.   Frequency: regular.   Perceived severity is strong.    Fetal activity: Perceived fetal activity is normal.    Prenatal complications: no prenatal complications Prenatal Complications - Diabetes: none.    OB History   Grav Para Term Preterm Abortions TAB SAB Ect Mult Living   4 1 1  0 2 0 2 0 0 1     Past Medical History  Diagnosis Date  . MTHFR mutation   . MTHFR deficiency complicating pregnancy   . History of miscarriage    Past Surgical History  Procedure Laterality Date  . Dilation and curettage of uterus  2012    with 2nd SAB   Family History: family history is negative for Anesthesia problems, Hypotension, Malignant hyperthermia, and Pseudochol deficiency. Social History:  reports that she quit smoking about 2 years ago. Her smoking use included Cigarettes. She smoked 0.00 packs per day. She has never used smokeless tobacco. She reports that she does not drink alcohol or use illicit drugs.   Prenatal Transfer Tool  Maternal Diabetes: No Genetic Screening: Declined Maternal Ultrasounds/Referrals: Normal Fetal Ultrasounds or other Referrals:  None Maternal Substance Abuse:  No Significant Maternal Medications:  None Significant Maternal Lab Results:  Lab values include: Group B Strep negative Other Comments:  None  ROS neg  Dilation: 7 Effacement (%): 90 Station: -2 Exam by:: Joana Reamer, RN BSN Blood pressure 129/74, pulse 67, temperature 98.1 F (36.7 C), temperature source Oral, resp. rate 24, height 5\' 7"  (1.702 m), weight 80.74 kg (178 lb). Exam Physical Exam  Constitutional: She is oriented to person, place, and time. She appears well-developed and well-nourished.  HENT:   Head: Atraumatic.  Eyes: EOM are normal.  Neck: Neck supple.  Cardiovascular: Normal rate and regular rhythm.   Respiratory: Breath sounds normal.  GI: Soft.  Neurological: She is alert and oriented to person, place, and time.  Skin: Skin is warm and dry.  Psychiatric: She has a normal mood and affect.    Prenatal labs: ABO, Rh:  A positive Antibody:  neg Rubella:  Immune RPR:   NR HBsAg:   neg HIV:   NR GBS:  neg  Assessment/Plan: Active phase Term gestation P) admit  Routine labs declines pain meds. Anticipate SVD   Eular Panek A 09/23/2013, 10:25 AM

## 2013-09-24 LAB — CBC
Hemoglobin: 11.9 g/dL — ABNORMAL LOW (ref 12.0–15.0)
MCV: 91.3 fL (ref 78.0–100.0)
Platelets: 160 10*3/uL (ref 150–400)
RBC: 3.69 MIL/uL — ABNORMAL LOW (ref 3.87–5.11)
RDW: 12.6 % (ref 11.5–15.5)
WBC: 12.5 10*3/uL — ABNORMAL HIGH (ref 4.0–10.5)

## 2013-09-24 MED ORDER — OXYCODONE-ACETAMINOPHEN 5-325 MG PO TABS: 1.0000 | ORAL_TABLET | ORAL | Status: DC | PRN

## 2013-09-24 MED ORDER — IBUPROFEN 600 MG PO TABS: 600.0000 mg | ORAL_TABLET | Freq: Four times a day (QID) | ORAL | Status: DC

## 2013-09-24 NOTE — Lactation Note (Signed)
This note was copied from the chart of [facility]. Lactation Consultation Note  Patient Name: Valerie Conley IONGE'X Date: 09/24/2013 Reason for consult: Initial assessment Patient is an experienced breastfeeding mother. She denies having any concerns with breastfeeding this baby and that he is getting better with each feeding. She reports the need to "pinch/compress" right nipple for baby to latch because of inversion. She states " once he has it then he feeds well". Reviewed lactation brochure and services available following discharge. Patient is waiting for circumcision assessment and hopeful to be discharged early in the afternoon. Ped appointment scheduled for tomorrow with private ped in [place].  Maternal Data Formula Feeding for Exclusion: No Infant to breast within first hour of birth: Yes Has patient been taught Hand Expression?: Yes Does the patient have breastfeeding experience prior to this delivery?: Yes  Feeding Feeding Type: Breast Fed Length of feed: 13 min  LATCH Score/Interventions Latch: Repeated attempts needed to sustain latch, nipple held in mouth throughout feeding, stimulation needed to elicit sucking reflex. (reported per mother)  Audible Swallowing: A few with stimulation  Type of Nipple: Everted at rest and after stimulation (everted left nipple and inverted right nipple per mother)  Comfort (Breast/Nipple): Soft / non-tender     Hold (Positioning): No assistance needed to correctly position infant at breast.  LATCH Score: 8  Lactation Tools Discussed/Used     Consult Status Consult Status: Complete    Omar Person 09/24/2013, 12:10 PM

## 2013-09-24 NOTE — Progress Notes (Signed)
PPD 1 SVD  S:  Reports feeling well - wants DC today if possible             Tolerating po/ No nausea or vomiting             Bleeding is light             Pain controlled with motrin and percocet             Up ad lib / ambulatory / voiding QS  Newborn breast feeding  / circumcision planned after Peds visit  O:               VS: BP 126/61  Pulse 48  Temp(Src) 97.8 F (36.6 C) (Oral)  Resp 17  Ht 5\' 7"  (1.702 m)  Wt 80.74 kg (178 lb)  BMI 27.87 kg/m2  SpO2 99%   LABS:              Recent Labs  09/23/13 1012 09/24/13 0630  WBC 13.2* 12.5*  HGB 13.8 11.9*  PLT 176 160               Blood type:  A positive  Rubella:    Immune                     Physical Exam:             Alert and oriented X3  Lungs: Clear and unlabored  Heart: regular rate and rhythm / no mumurs  Abdomen: soft, non-tender, non-distended              Fundus: firm, non-tender, Ueven  Perineum: mild edema  Lochia: light-moderate  Extremities: no edema, no calf pain or tenderness    A: PPD # 1   Doing well - stable status  P: Routine post partum orders  DC home today  Marlinda Mike CNM, MSN, [facility] 09/24/2013, 10:21 AM

## 2013-09-24 NOTE — Discharge Summary (Signed)
Obstetric Discharge Summary  Reason for Admission: onset of labor Prenatal Procedures: none Intrapartum Procedures: spontaneous vaginal delivery Postpartum Procedures: none Complications-Operative and Postpartum: none Hemoglobin  Date Value Range Status  09/24/2013 11.9* 12.0 - 15.0 g/dL Final     HCT  Date Value Range Status  09/24/2013 33.7* 36.0 - 46.0 % Final    Physical Exam:  General: alert, cooperative and no distress Lochia: appropriate Uterine Fundus: firm Incision: healing well DVT Evaluation: No evidence of DVT seen on physical exam.  Discharge Diagnoses: Term Pregnancy-delivered  Discharge Information: Date: 09/24/2013 Activity: pelvic rest Diet: routine Medications: PNV, Ibuprofen and Percocet and b12 Condition: stable Instructions: refer to practice specific booklet Discharge to: home Follow-up Information   Follow up with COUSINS,SHERONETTE A, MD. Schedule an appointment as soon as possible for a visit in 6 weeks.   Specialty:  Obstetrics and Gynecology   Contact information:   [address] [place] [address] [phone]       Newborn Data: Live born female  Birth Weight: 8 lb 6 oz (3799 g) APGAR: 8, 9  Home with mother.  Valerie Conley 09/24/2013, 10:24 AM

## 2014-07-29 ENCOUNTER — Encounter (HOSPITAL_COMMUNITY): Payer: Self-pay | Admitting: *Deleted

## 2014-09-27 NOTE — L&D Delivery Note (Signed)
Delivery Note At 1:43 PM a viable and healthy female was delivered via Vaginal, Spontaneous Delivery (Presentation: ; Occiput Anterior).  APGAR: 8, 9; weight 9 lb 11.4 oz (4405 g).   Placenta status: Intact, Spontaneous. Not sent Cord: 3 vessels with the following complications: None.  Cord pH: none  Anesthesia: None  Episiotomy: None Lacerations: None Suture Repair: n/a Est. Blood Loss (mL): 803. Increased bleeding noted. Bladder cath for moderate amount of urine. Cervix inspected. No laceration noted. Bimanual exam done  Mom to postpartum.  Baby to Couplet care / Skin to Skin.  Lamon Rotundo A 08/04/2015, 8:40 PM

## 2014-12-31 LAB — OB RESULTS CONSOLE ABO/RH: "RH Type ": POSITIVE

## 2014-12-31 LAB — OB RESULTS CONSOLE ANTIBODY SCREEN: ANTIBODY SCREEN: NEGATIVE

## 2014-12-31 LAB — OB RESULTS CONSOLE HEPATITIS B SURFACE ANTIGEN: Hepatitis B Surface Ag: NEGATIVE

## 2014-12-31 LAB — OB RESULTS CONSOLE RPR: RPR: NONREACTIVE

## 2014-12-31 LAB — OB RESULTS CONSOLE HIV ANTIBODY (ROUTINE TESTING): HIV: NONREACTIVE

## 2014-12-31 LAB — OB RESULTS CONSOLE RUBELLA ANTIBODY, IGM: Rubella: IMMUNE

## 2015-01-14 LAB — OB RESULTS CONSOLE GC/CHLAMYDIA
CHLAMYDIA, DNA PROBE: NEGATIVE
GC PROBE AMP, GENITAL: NEGATIVE

## 2015-05-16 ENCOUNTER — Inpatient Hospital Stay (HOSPITAL_COMMUNITY)
Admission: AD | Admit: 2015-05-16 | Discharge: 2015-05-16 | Disposition: A | Discharge status: Home / Self Care | Payer: PRIVATE HEALTH INSURANCE | Source: Ambulatory Visit | Attending: Obstetrics and Gynecology | Admitting: Obstetrics and Gynecology

## 2015-05-16 ENCOUNTER — Encounter (HOSPITAL_COMMUNITY): Payer: Self-pay

## 2015-05-16 ENCOUNTER — Inpatient Hospital Stay (HOSPITAL_COMMUNITY): Payer: PRIVATE HEALTH INSURANCE

## 2015-05-16 DIAGNOSIS — Z331 Pregnant state, incidental: Secondary | ICD-10-CM | POA: Diagnosis not present

## 2015-05-16 DIAGNOSIS — N390 Urinary tract infection, site not specified: Secondary | ICD-10-CM | POA: Diagnosis present

## 2015-05-16 DIAGNOSIS — Z3A28 28 weeks gestation of pregnancy: Secondary | ICD-10-CM

## 2015-05-16 DIAGNOSIS — O4693 Antepartum hemorrhage, unspecified, third trimester: Secondary | ICD-10-CM

## 2015-05-16 DIAGNOSIS — Z3490 Encounter for supervision of normal pregnancy, unspecified, unspecified trimester: Secondary | ICD-10-CM

## 2015-05-16 DIAGNOSIS — O3429 Maternal care due to uterine scar from other previous surgery: Secondary | ICD-10-CM

## 2015-05-16 DIAGNOSIS — N3091 Cystitis, unspecified with hematuria: Secondary | ICD-10-CM | POA: Diagnosis not present

## 2015-05-16 LAB — URINALYSIS, ROUTINE W REFLEX MICROSCOPIC
BILIRUBIN URINE: NEGATIVE
Glucose, UA: NEGATIVE mg/dL
KETONES UR: NEGATIVE mg/dL
Leukocytes, UA: NEGATIVE
NITRITE: NEGATIVE
PH: 8.5 — AB (ref 5.0–8.0)
Protein, ur: 30 mg/dL — AB
Specific Gravity, Urine: 1.02 (ref 1.005–1.030)
Urobilinogen, UA: 0.2 mg/dL (ref 0.0–1.0)

## 2015-05-16 LAB — URINE MICROSCOPIC-ADD ON

## 2015-05-16 LAB — SAVE SMEAR: Smear Review: NONE SEEN

## 2015-05-16 LAB — PROTIME-INR
INR: 1.18 (ref 0.00–1.49)
Prothrombin Time: 15.2 seconds (ref 11.6–15.2)

## 2015-05-16 LAB — PLATELET COUNT: Platelets: 189 10*3/uL (ref 150–400)

## 2015-05-16 LAB — APTT: aPTT: 25 seconds (ref 24–37)

## 2015-05-16 LAB — FIBRINOGEN: Fibrinogen: 392 mg/dL (ref 204–475)

## 2015-05-16 MED ORDER — NITROFURANTOIN MONOHYD MACRO 100 MG PO CAPS
100.0000 mg | ORAL_CAPSULE | Freq: Two times a day (BID) | ORAL | Status: DC
  Administered 2015-05-16: 100 mg via ORAL
  Filled 2015-05-16: qty 1

## 2015-05-16 MED ORDER — NITROFURANTOIN MONOHYD MACRO 100 MG PO CAPS: 100.0000 mg | ORAL_CAPSULE | Freq: Two times a day (BID) | ORAL | Status: DC

## 2015-05-16 NOTE — MAU Provider Note (Signed)
History     Pt seen by CNM Reviewed hx with pt. Denies any vaginal bleeding on underwear. Reports bleeding with wiping for void only   OB History    Gravida Para Term Preterm AB TAB SAB Ectopic Multiple Living   0 2 0 2 0 0 2      Past Medical History  Diagnosis Date  . MTHFR mutation   . MTHFR deficiency complicating pregnancy   . History of miscarriage   . Normal labor 09/23/2013  . Postpartum care following vaginal delivery (12/28) 09/23/2013    Past Surgical History  Procedure Laterality Date  . Dilation and curettage of uterus  2012    with 2nd SAB  . Uterine septum resection N/A     Family History  Problem Relation Age of Onset  . Anesthesia problems Neg Hx   . Hypotension Neg Hx   . Malignant hyperthermia Neg Hx   . Pseudochol deficiency Neg Hx     Social History  Substance Use Topics  . Smoking status: Former Smoker    Types: Cigarettes    Quit date: 07/30/2011  . Smokeless tobacco: Never Used  . Alcohol Use: No    Allergies:  Allergies  Allergen Reactions  . Penicillins Nausea And Vomiting and Rash    Prescriptions prior to admission  Medication Sig Dispense Refill Last Dose  . Cyanocobalamin (VITAMIN B-12 PO) Take 1 tablet by mouth daily.    05/15/2015 at Unknown time  . folic acid (FOLVITE) 1 MG tablet Take 4 mg by mouth daily.   05/15/2015 at Unknown time  . Prenatal Vit-Fe Fumarate-FA (PRENATAL MULTIVITAMIN) TABS Take 1 tablet by mouth every morning.   05/15/2015 at Unknown time  . pyridOXINE (B-6) 50 MG tablet Take 25 mg by mouth 3 (three) times daily.   05/16/2015 at Unknown time  . VITAMIN D, ERGOCALCIFEROL, PO Take 1 tablet by mouth daily.   05/15/2015 at Unknown time  . ibuprofen (ADVIL,MOTRIN) 600 MG tablet Take 1 tablet (600 mg total) by mouth every 6 (six) hours. (Patient not taking: Reported on 05/16/2015) 30 tablet 0   . oxyCODONE-acetaminophen (PERCOCET/ROXICET) 5-325 MG per tablet Take 1 tablet by mouth every 4 (four) hours as  needed for severe pain (moderate - severe pain). (Patient not taking: Reported on 05/16/2015) 20 tablet 0    Urinalysis    Component Value Date/Time   COLORURINE YELLOW 05/16/2015 1130   APPEARANCEUR CLOUDY* 05/16/2015 1130   LABSPEC 1.020 05/16/2015 1130   PHURINE 8.5* 05/16/2015 1130   GLUCOSEU NEGATIVE 05/16/2015 1130   HGBUR LARGE* 05/16/2015 1130   BILIRUBINUR NEGATIVE 05/16/2015 1130   KETONESUR NEGATIVE 05/16/2015 1130   PROTEINUR 30* 05/16/2015 1130   UROBILINOGEN 0.2 05/16/2015 1130   NITRITE NEGATIVE 05/16/2015 1130   LEUKOCYTESUR NEGATIVE 05/16/2015 1130    ucx sent   Physical Exam   Blood pressure 109/62, pulse 70, temperature 98 F (36.7 C), temperature source Oral, resp. rate 18, height  (1.702 m), weight 78.926 kg (174 lb), not currently breastfeeding.  No exam performed today, done by CNM. Hemorrhagic cystitis P) macrobid  po now then bid. Call for ucx Sunday D/c admit order given u/a and hx Instruct to call if vaginal bleeding, decreased FM , pTL D/c home Keep sched OB appt ED Course   MDM   Cayci Mcnabb A, MD 1:23 PM 05/16/2015

## 2015-05-16 NOTE — Progress Notes (Signed)
Called for order clarification to u/s, no new orders.

## 2015-05-16 NOTE — Plan of Care (Signed)
Pt. Urine in lab 

## 2015-05-16 NOTE — MAU Note (Signed)
Pt states here for vag bleeding that began this am. Noted only when voiding or wiping. Was told via u/s 08/05 that previa had moved slightly. Last intercourse yesterday.

## 2015-05-16 NOTE — Discharge Instructions (Signed)
PTL precautions, vaginal bleeding. Drink cranberry juice, increase po fluid. Call Sunday for Urine culture results, decreased FM

## 2015-05-18 LAB — CULTURE, OB URINE: SPECIAL REQUESTS: NORMAL

## 2015-07-11 LAB — OB RESULTS CONSOLE GBS: GBS: NEGATIVE

## 2015-08-04 ENCOUNTER — Inpatient Hospital Stay (HOSPITAL_COMMUNITY)
Admission: AD | Admit: 2015-08-04 | Discharge: 2015-08-06 | DRG: 774 | Disposition: A | Discharge status: Home / Self Care | Payer: PRIVATE HEALTH INSURANCE | Source: Ambulatory Visit | Attending: Obstetrics and Gynecology | Admitting: Obstetrics and Gynecology

## 2015-08-04 ENCOUNTER — Encounter (HOSPITAL_COMMUNITY): Payer: Self-pay | Admitting: *Deleted

## 2015-08-04 DIAGNOSIS — Z87891 Personal history of nicotine dependence: Secondary | ICD-10-CM | POA: Diagnosis not present

## 2015-08-04 DIAGNOSIS — O3663X0 Maternal care for excessive fetal growth, third trimester, not applicable or unspecified: Principal | ICD-10-CM | POA: Diagnosis present

## 2015-08-04 DIAGNOSIS — O2243 Hemorrhoids in pregnancy, third trimester: Secondary | ICD-10-CM | POA: Diagnosis present

## 2015-08-04 DIAGNOSIS — Z3A40 40 weeks gestation of pregnancy: Secondary | ICD-10-CM

## 2015-08-04 DIAGNOSIS — IMO0001 Reserved for inherently not codable concepts without codable children: Secondary | ICD-10-CM

## 2015-08-04 DIAGNOSIS — N3091 Cystitis, unspecified with hematuria: Secondary | ICD-10-CM

## 2015-08-04 LAB — CBC
HEMATOCRIT: 37.2 % (ref 36.0–46.0)
HEMATOCRIT: 35.4 % — AB (ref 36.0–46.0)
Hemoglobin: 13 g/dL (ref 12.0–15.0)
Hemoglobin: 12.2 g/dL (ref 12.0–15.0)
MCH: 32.1 pg (ref 26.0–34.0)
MCH: 32.2 pg (ref 26.0–34.0)
MCHC: 34.9 g/dL (ref 30.0–36.0)
MCHC: 34.5 g/dL (ref 30.0–36.0)
MCV: 91.9 fL (ref 78.0–100.0)
MCV: 93.4 fL (ref 78.0–100.0)
PLATELETS: 187 10*3/uL (ref 150–400)
Platelets: 181 10*3/uL (ref 150–400)
RBC: 4.05 MIL/uL (ref 3.87–5.11)
RBC: 3.79 MIL/uL — AB (ref 3.87–5.11)
RDW: 13.1 % (ref 11.5–15.5)
RDW: 13.3 % (ref 11.5–15.5)
WBC: 8.7 10*3/uL (ref 4.0–10.5)
WBC: 23.2 10*3/uL — AB (ref 4.0–10.5)

## 2015-08-04 LAB — RPR: RPR Ser Ql: NONREACTIVE

## 2015-08-04 LAB — TYPE AND SCREEN
ABO/RH(D): A POS
Antibody Screen: NEGATIVE

## 2015-08-04 MED ORDER — OXYTOCIN BOLUS FROM INFUSION: 500.0000 mL | INTRAVENOUS | Status: DC

## 2015-08-04 MED ORDER — FERROUS SULFATE 325 (65 FE) MG PO TABS
325.0000 mg | ORAL_TABLET | Freq: Two times a day (BID) | ORAL | Status: DC
  Administered 2015-08-04 – 2015-08-06 (×4): 325 mg via ORAL
  Filled 2015-08-04 (×4): qty 1

## 2015-08-04 MED ORDER — ONDANSETRON HCL 4 MG PO TABS
4.0000 mg | ORAL_TABLET | ORAL | Status: DC | PRN
Start: 2015-08-04 — End: 2015-08-06

## 2015-08-04 MED ORDER — BENZOCAINE-MENTHOL 20-0.5 % EX AERO
1.0000 "application " | INHALATION_SPRAY | CUTANEOUS | Status: DC | PRN
  Administered 2015-08-04: 1 via TOPICAL
  Filled 2015-08-04: qty 56

## 2015-08-04 MED ORDER — LACTATED RINGERS IV SOLN
INTRAVENOUS | Status: DC
  Administered 2015-08-04 (×2): via INTRAVENOUS

## 2015-08-04 MED ORDER — ONDANSETRON HCL 4 MG/2ML IJ SOLN: 4.0000 mg | INTRAMUSCULAR | Status: DC | PRN

## 2015-08-04 MED ORDER — IBUPROFEN 600 MG PO TABS
600.0000 mg | ORAL_TABLET | Freq: Four times a day (QID) | ORAL | Status: DC | PRN
  Administered 2015-08-04: 600 mg via ORAL
  Filled 2015-08-04: qty 1

## 2015-08-04 MED ORDER — SIMETHICONE 80 MG PO CHEW: 80.0000 mg | CHEWABLE_TABLET | ORAL | Status: DC | PRN

## 2015-08-04 MED ORDER — DIPHENHYDRAMINE HCL 25 MG PO CAPS: 25.0000 mg | ORAL_CAPSULE | Freq: Four times a day (QID) | ORAL | Status: DC | PRN

## 2015-08-04 MED ORDER — OXYCODONE-ACETAMINOPHEN 5-325 MG PO TABS: 2.0000 | ORAL_TABLET | ORAL | Status: DC | PRN

## 2015-08-04 MED ORDER — DIBUCAINE 1 % RE OINT: 1.0000 "application " | TOPICAL_OINTMENT | RECTAL | Status: DC | PRN

## 2015-08-04 MED ORDER — WITCH HAZEL-GLYCERIN EX PADS
1.0000 "application " | MEDICATED_PAD | CUTANEOUS | Status: DC | PRN
  Administered 2015-08-04: 1 via TOPICAL

## 2015-08-04 MED ORDER — ONDANSETRON HCL 4 MG/2ML IJ SOLN: 4.0000 mg | Freq: Four times a day (QID) | INTRAMUSCULAR | Status: DC | PRN

## 2015-08-04 MED ORDER — LIDOCAINE HCL (PF) 1 % IJ SOLN
30.0000 mL | INTRAMUSCULAR | Status: DC | PRN
  Filled 2015-08-04: qty 30

## 2015-08-04 MED ORDER — FLEET ENEMA 7-19 GM/118ML RE ENEM: 1.0000 | ENEMA | RECTAL | Status: DC | PRN

## 2015-08-04 MED ORDER — METHYLERGONOVINE MALEATE 0.2 MG/ML IJ SOLN: 0.2000 mg | INTRAMUSCULAR | Status: AC

## 2015-08-04 MED ORDER — SENNOSIDES-DOCUSATE SODIUM 8.6-50 MG PO TABS
2.0000 | ORAL_TABLET | ORAL | Status: DC
  Administered 2015-08-04 – 2015-08-06 (×2): 2 via ORAL
  Filled 2015-08-04 (×2): qty 2

## 2015-08-04 MED ORDER — MISOPROSTOL 200 MCG PO TABS
ORAL_TABLET | ORAL | Status: AC
  Filled 2015-08-04: qty 4

## 2015-08-04 MED ORDER — ZOLPIDEM TARTRATE 5 MG PO TABS: 5.0000 mg | ORAL_TABLET | Freq: Every evening | ORAL | Status: DC | PRN

## 2015-08-04 MED ORDER — LACTATED RINGERS IV SOLN: 500.0000 mL | INTRAVENOUS | Status: DC | PRN

## 2015-08-04 MED ORDER — METHYLERGONOVINE MALEATE 0.2 MG PO TABS
0.2000 mg | ORAL_TABLET | ORAL | Status: AC
  Administered 2015-08-04 – 2015-08-05 (×6): 0.2 mg via ORAL
  Filled 2015-08-04 (×6): qty 1

## 2015-08-04 MED ORDER — IBUPROFEN 600 MG PO TABS
600.0000 mg | ORAL_TABLET | Freq: Four times a day (QID) | ORAL | Status: DC
  Administered 2015-08-04 – 2015-08-06 (×7): 600 mg via ORAL
  Filled 2015-08-04 (×7): qty 1

## 2015-08-04 MED ORDER — OXYTOCIN 40 UNITS IN LACTATED RINGERS INFUSION - SIMPLE MED
62.5000 mL/h | INTRAVENOUS | Status: DC
  Filled 2015-08-04: qty 1000

## 2015-08-04 MED ORDER — CITRIC ACID-SODIUM CITRATE 334-500 MG/5ML PO SOLN: 30.0000 mL | ORAL | Status: DC | PRN

## 2015-08-04 MED ORDER — METHYLERGONOVINE MALEATE 0.2 MG/ML IJ SOLN
0.2000 mg | Freq: Once | INTRAMUSCULAR | Status: AC
  Administered 2015-08-04: 0.2 mg via INTRAMUSCULAR

## 2015-08-04 MED ORDER — ACETAMINOPHEN 325 MG PO TABS
650.0000 mg | ORAL_TABLET | ORAL | Status: DC | PRN
  Administered 2015-08-04: 650 mg via ORAL
  Filled 2015-08-04: qty 2

## 2015-08-04 MED ORDER — MISOPROSTOL 200 MCG PO TABS
800.0000 ug | ORAL_TABLET | Freq: Once | ORAL | Status: AC
  Administered 2015-08-04: 800 ug via RECTAL

## 2015-08-04 MED ORDER — OXYCODONE-ACETAMINOPHEN 5-325 MG PO TABS: 1.0000 | ORAL_TABLET | ORAL | Status: DC | PRN

## 2015-08-04 MED ORDER — LANOLIN HYDROUS EX OINT: TOPICAL_OINTMENT | CUTANEOUS | Status: DC | PRN

## 2015-08-04 MED ORDER — TERBUTALINE SULFATE 1 MG/ML IJ SOLN
0.2500 mg | Freq: Once | INTRAMUSCULAR | Status: DC | PRN
  Filled 2015-08-04: qty 1

## 2015-08-04 MED ORDER — PRENATAL MULTIVITAMIN CH
1.0000 | ORAL_TABLET | Freq: Every day | ORAL | Status: DC
  Administered 2015-08-05: 1 via ORAL
  Filled 2015-08-04 (×2): qty 1

## 2015-08-04 MED ORDER — ACETAMINOPHEN 325 MG PO TABS: 650.0000 mg | ORAL_TABLET | ORAL | Status: DC | PRN

## 2015-08-04 MED ORDER — OXYTOCIN 40 UNITS IN LACTATED RINGERS INFUSION - SIMPLE MED
1.0000 m[IU]/min | INTRAVENOUS | Status: DC
Start: 2015-08-04 — End: 2015-08-04
  Administered 2015-08-04: 2 m[IU]/min via INTRAVENOUS

## 2015-08-04 MED ORDER — MEASLES, MUMPS & RUBELLA VAC ~~LOC~~ INJ: 0.5000 mL | INJECTION | Freq: Once | SUBCUTANEOUS | Status: DC

## 2015-08-04 NOTE — MAU Note (Signed)
PT  SAYS HURT  BAD      SINCE  0300.  VE  IN OFFICE  2  CM.    DENIES HSV AND MRSA.   GBS- NEG

## 2015-08-04 NOTE — H&P (Signed)
Valerie Conley is a 34 y.o. female presenting for admission due to early labor at term with hx of rapid delivery. GBS cx neg. PNC notable for fetal macrosomia. GCT nl  Maternal Medical History:  Reason for admission: Contractions.   Contractions: Onset was 6-12 hours ago.   Frequency: irregular.   Perceived severity is moderate.    Fetal activity: Perceived fetal activity is normal.    Prenatal complications: Fetal macrosomia    OB History    Gravida Para Term Preterm AB TAB SAB Ectopic Multiple Living   5 2 2  0 2 0 2 0 0 2     Past Medical History  Diagnosis Date  . MTHFR mutation (HCC)   . MTHFR deficiency complicating pregnancy (HCC)   . History of miscarriage   . Normal labor 09/23/2013  . Postpartum care following vaginal delivery (12/28) 09/23/2013   Past Surgical History  Procedure Laterality Date  . Dilation and curettage of uterus  2012    with 2nd SAB  . Uterine septum resection N/A    Family History: family history is negative for Anesthesia problems, Hypotension, Malignant hyperthermia, and Pseudochol deficiency. Social History:  reports that she quit smoking about 4 years ago. Her smoking use included Cigarettes. She has never used smokeless tobacco. She reports that she does not drink alcohol or use illicit drugs.   Prenatal Transfer Tool  Maternal Diabetes: No Genetic Screening: Declined Maternal Ultrasounds/Referrals: Normal Fetal Ultrasounds or other Referrals:  None Maternal Substance Abuse:  No Significant Maternal Medications:  Meds include: Other: folic acid 4 mg, vit b6 Significant Maternal Lab Results:  Lab values include: Group B Strep negative Other Comments:  MTHFR heterozygous  ROS neg  Dilation: 3.5 Effacement (%): 80 Station: -2 Exam by:: DCALLAWAY, RN    Blood pressure 134/87, pulse 73, temperature 98.3 F (36.8 C), temperature source Oral, resp. rate 20, height 5\' 7"  (1.702 m), weight 85.843 kg (189 lb 4 oz), not currently  breastfeeding. Exam Physical Exam  Constitutional: She is oriented to person, place, and time. She appears well-developed and well-nourished.  HENT:  Head: Atraumatic.  Eyes: EOM are normal.  Cardiovascular: Normal rate and regular rhythm.   Respiratory: Breath sounds normal.  GI: Soft.  Musculoskeletal: She exhibits edema.  Neurological: She is alert and oriented to person, place, and time.  Skin: Skin is warm and dry.  Psychiatric: She has a normal mood and affect.   VE 4/80/-3  Prenatal labs: ABO, Rh:  A positive Antibody:  negative Rubella:  Immune RPR:   NR HBsAg:   neg HIV:   neg GBS:   neg  Assessment/Plan: Early labor Term gestation P) admit routine labs intermittent fetal monitoring. Declines pain med. ambulate   Sandra Brents A 08/04/2015, 5:14 AM

## 2015-08-04 NOTE — Lactation Note (Signed)
This note was copied from the chart [facility]. Lactation Consultation Note Initial visit at 6 hours of age.  Mom reports 2 good feedings and trying now but baby is sleepy and not waking up.  Tech to do baby's bath and then STS with mom.  MOm to call for assist with latch as needed.  Mom is experienced with 2 older children breastfeeding and denies concerns.  [facility] given and discussed.  Encouraged to feed with early cues on demand.  Early newborn behavior discussed.  Hand expression reported by mom with colostrum visible.  Mom to call for assist as needed.    Patient Name: Valerie Conley UJWJX'BToday's Date: 08/04/2015 Reason for consult: Initial assessment   Maternal Data Has patient been taught Hand Expression?: Yes Does the patient have breastfeeding experience prior to this delivery?: Yes  Feeding    LATCH Score/Interventions Latch: Too sleepy or reluctant, no latch achieved, no sucking elicited.                    Lactation Tools Discussed/Used     Consult Status Consult Status: Follow-up Date: 08/05/15 Follow-up type: In-patient    Jannifer RodneyShoptaw, Solyana Nonaka Lynn 08/04/2015, 8:27 PM

## 2015-08-04 NOTE — Progress Notes (Signed)
MD request monitor - hx rapid labor  S:  Painful ctx - breathing and moaning with ctx        Declines analgesia  O:  VS: Blood pressure 139/75, pulse 61, temperature 97.4 F (36.3 C), temperature source Oral, resp. rate 20, height 5\' 7"  (1.702 m), weight 85.843 kg (189 lb 4 oz), not currently breastfeeding.        FHR intermittently : baseline 125 / no  decelerations        Toco: contractions every 2-4 minutes / moderate        Cervix : 7-8cm / 90% vtx  / 0 station        Membranes: clear fluid leak with small show  A: active labor     P: expectant management    Marlinda MikeBAILEY, Carma Dwiggins CNM, MSN, FACNM 08/04/2015, 11:23 AM

## 2015-08-05 LAB — CBC
HEMATOCRIT: 28.1 % — AB (ref 36.0–46.0)
HEMOGLOBIN: 9.9 g/dL — AB (ref 12.0–15.0)
MCH: 32.6 pg (ref 26.0–34.0)
MCHC: 35.2 g/dL (ref 30.0–36.0)
MCV: 92.4 fL (ref 78.0–100.0)
Platelets: 144 10*3/uL — ABNORMAL LOW (ref 150–400)
RBC: 3.04 MIL/uL — ABNORMAL LOW (ref 3.87–5.11)
RDW: 13.4 % (ref 11.5–15.5)
WBC: 12.7 10*3/uL — ABNORMAL HIGH (ref 4.0–10.5)

## 2015-08-05 LAB — CCBB MATERNAL DONOR DRAW

## 2015-08-05 MED ORDER — FOLIC ACID 1 MG PO TABS
4.0000 mg | ORAL_TABLET | Freq: Every day | ORAL | Status: DC
  Filled 2015-08-05 (×2): qty 4

## 2015-08-05 NOTE — Progress Notes (Addendum)
Patient ID: Valerie Conley, female   DOB: 07/05/1981, 34 y.o.   MRN: [id number] PPD # 1 SVD  S:  Reports feeling well             Tolerating po/ No nausea or vomiting             Bleeding is light             Pain controlled with ibuprofen (OTC)   Desires to restart Folic acid 4 mg for MTHFR             Up ad lib / ambulatory / voiding without difficulties    Newborn  Information for the patient's newborn:  Damaris SchoonerHyler, Girl Lanora Manislizabeth [[id number]][[id number]]  female  breast feeding    O:  A & O x 3, in no apparent distress              VS:  Filed Vitals:   08/04/15 2145 08/05/15 0157 08/05/15 0610 08/05/15 1005  BP: 125/60 140/72 112/54 115/73  Pulse: 75  53 65  Temp: 98.5 F (36.9 C)  98.2 F (36.8 C)   TempSrc: Oral  Oral   Resp: 16  18 18   Height:      Weight:      SpO2:        LABS:  Recent Labs  08/04/15 1515 08/05/15 0540  WBC 23.2* 12.7*  HGB 12.2 9.9*  HCT 35.4* 28.1*  PLT 187 144*    Blood type: A POS (11/07 0545)  Rubella: Immune (04/05 0000)   I&O: I/O last 3 completed shifts: In: -  Out: 803 [Blood:803]             Lungs: Clear and unlabored  Heart: regular rate and rhythm / no murmurs  Abdomen: soft, non-tender, non-distended             Fundus: firm, non-tender, U-3  Perineum: intact  Lochia: minimal  Extremities: No edema, no calf pain or tenderness, No Homans    A/P: PPD # 1  34 y.o., [id number]   Principal Problem:    Postpartum care following vaginal delivery (11/7)  Active Problems:    PPH (postpartum hemorrhage)   Doing well - stable status  Routine post partum orders  D/C Saline lock  Folic Acid 4mg  daily  Desires discharge tomorrow    Valerie Conley, Valerie Conley, M, MSN, CNM 08/05/2015, 10:00 AM

## 2015-08-06 MED ORDER — PRAMOXINE HCL 1 % RE FOAM: 1.0000 "application " | Freq: Every evening | RECTAL | Status: DC | PRN

## 2015-08-06 MED ORDER — HYDROCORTISONE ACE-PRAMOXINE 2.5-1 % RE CREA: 1.0000 "application " | TOPICAL_CREAM | Freq: Three times a day (TID) | RECTAL | Status: DC

## 2015-08-06 MED ORDER — IBUPROFEN 600 MG PO TABS: 600.0000 mg | ORAL_TABLET | Freq: Four times a day (QID) | ORAL | Status: DC

## 2015-08-06 MED ORDER — INFLUENZA VAC SPLIT QUAD 0.5 ML IM SUSY
0.5000 mL | PREFILLED_SYRINGE | INTRAMUSCULAR | Status: AC
  Administered 2015-08-06: 0.5 mL via INTRAMUSCULAR

## 2015-08-06 NOTE — Discharge Summary (Signed)
Obstetric Discharge Summary  Reason for Admission: onset of labor Prenatal Procedures: none Intrapartum Procedures: spontaneous vaginal delivery Postpartum Procedures: Rubella Ig Complications-Operative and Postpartum: none HEMOGLOBIN  Date Value Ref Range Status  08/05/2015 9.9* 12.0 - 15.0 g/dL Final   HCT  Date Value Ref Range Status  08/05/2015 28.1* 36.0 - 46.0 % Final    Physical Exam:  General: alert, cooperative and no distress Lochia: appropriate Uterine Fundus: firm Perineum: intact / clustered hemorrhoids DVT Evaluation: No evidence of DVT seen on physical exam.  Discharge Diagnoses: Term Pregnancy-delivered  Discharge Information: Date: 08/06/2015 Activity: pelvic rest Diet: routine Medications: PNV, Ibuprofen, Colace and procto-foam and cream Condition: stable Instructions: refer to practice specific booklet Discharge to: home Follow-up Information    Follow up with COUSINS,SHERONETTE A, MD. Schedule an appointment as soon as possible for a visit in 6 weeks.   Specialty:  Obstetrics and Gynecology   Contact information:   [address] [place] [id number] [phone]       Newborn Data: Live born female  Birth Weight: 9 lb 11.4 oz (4405 g) APGAR: 8, 9  Home with mother.  Marlinda MikeBAILEY, TANYA 08/06/2015, 9:04 AM

## 2015-08-06 NOTE — Lactation Note (Signed)
This note was copied from the chart [facility]. Lactation Consultation Note  Baby 43 hours old and sleeping in crib w/ pacifier. Mother's right nipple has small abrasion on tip.  Provided mother w/ comfort gels and discussed deep latch. Pacifier use not recommended at this time.  Reviewed engorgement care and monitoring voids/stools. Mom encouraged to feed baby 8-12 times/24 hours and with feeding cues.    Patient Name: Valerie Conley Kennerly JXBJY'NToday's Date: 08/06/2015 Reason for consult: Follow-up assessment   Maternal Data    Feeding Feeding Type: Breast Fed Length of feed: 30 min  LATCH Score/Interventions                      Lactation Tools Discussed/Used     Consult Status Consult Status: Complete    Hardie PulleyBerkelhammer, Desirre Eickhoff Boschen 08/06/2015, 9:09 AM

## 2015-08-06 NOTE — Progress Notes (Signed)
PPD 2 SVD  S:  Reports feeling well - only pain is hemorrhoids             Tolerating po/ No nausea or vomiting             Bleeding is light             Pain controlled with motrin and percocet             Up ad lib / ambulatory / voiding QS  Newborn breast feeding  O:               VS: BP 121/71 mmHg  Pulse 55  Temp(Src) 97.9 F (36.6 C) (Oral)  Resp 18  Ht 5\' 7"  (1.702 m)  Wt 85.843 kg (189 lb 4 oz)  BMI 29.63 kg/m2  SpO2 99%  Breastfeeding? Unknown   LABS:              Recent Labs  08/04/15 1515 08/05/15 0540  WBC 23.2* 12.7*  HGB 12.2 9.9*  PLT 187 144*               Blood type: --/--/A POS (11/07 0545)  Rubella: Immune (04/05 0000)                     I&O: Intake/Output      11/08 0701 - 11/09 0700 11/09 0701 - 11/10 0700   Blood     Total Output       Net                          Physical Exam:             Alert and oriented X3  Lungs: Clear and unlabored  Heart: regular rate and rhythm / no mumurs  Abdomen: soft, non-tender, non-distended              Fundus: firm, non-tender, U-1  Perineum: no edema / intact             Hemorrhoids: clustered pink and inflamed  Lochia: light  Extremities: no edema, no calf pain or tenderness   A: PPD # 2  Doing well - stable status  P: Routine post partum orders  DC home  Marlinda MikeBAILEY, Britnay Magnussen CNM, MSN, [facility] 08/06/2015, 8:24 AM

## 2016-12-18 IMAGING — US US OB DETAIL+14 WK
1 series · 12 of 28 positions shown · non-contrast
Comparison: none

[Series 1: us ob mfm follow up · 12 of 77 slices shown]
[im 3/77]
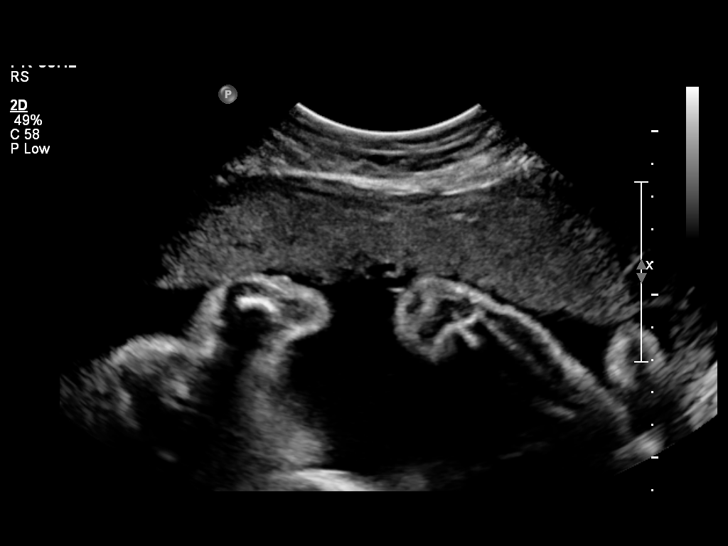
[im 9/77]
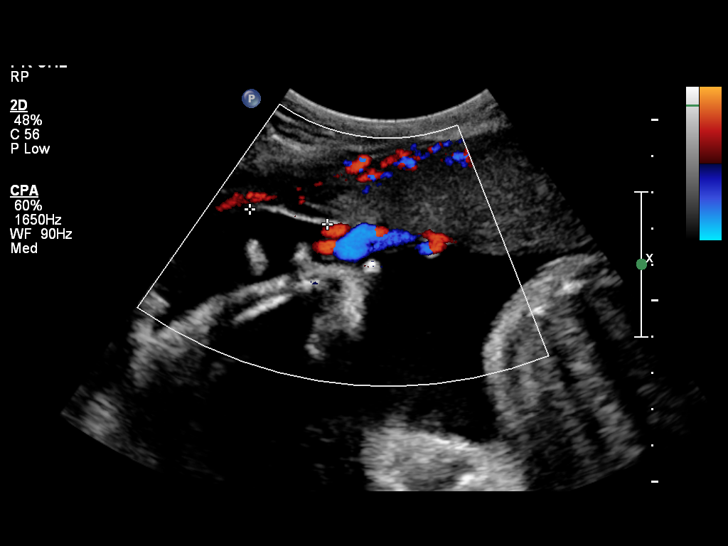
[im 15/77]
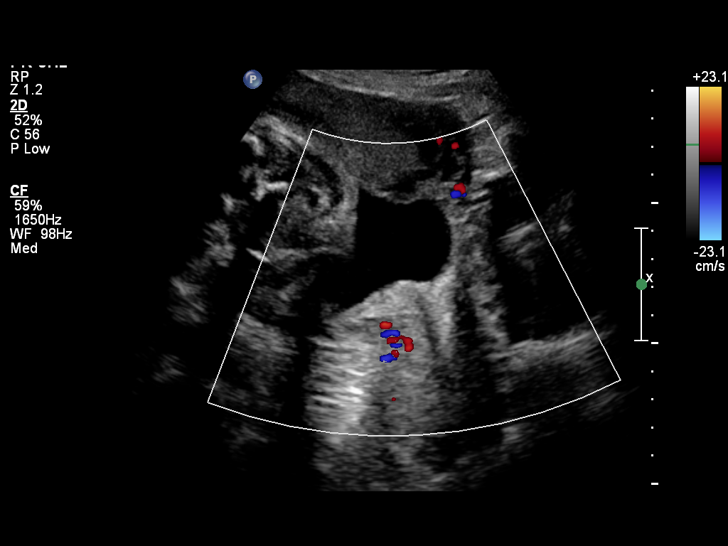
[im 23/77]
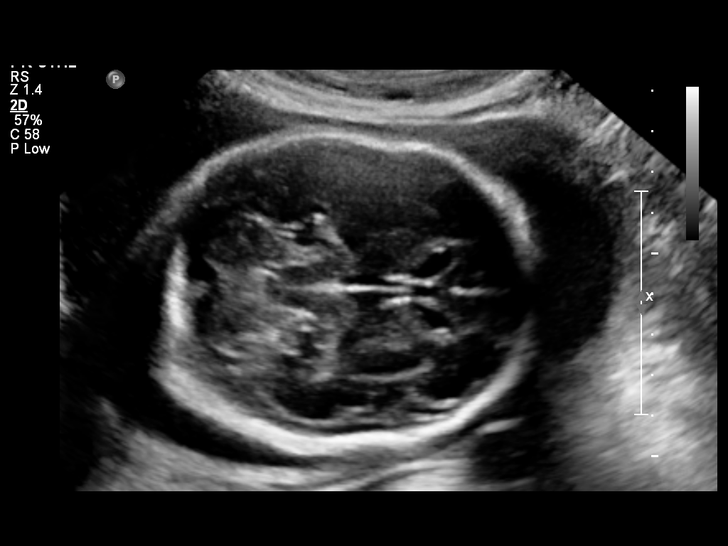
[im 29/77]
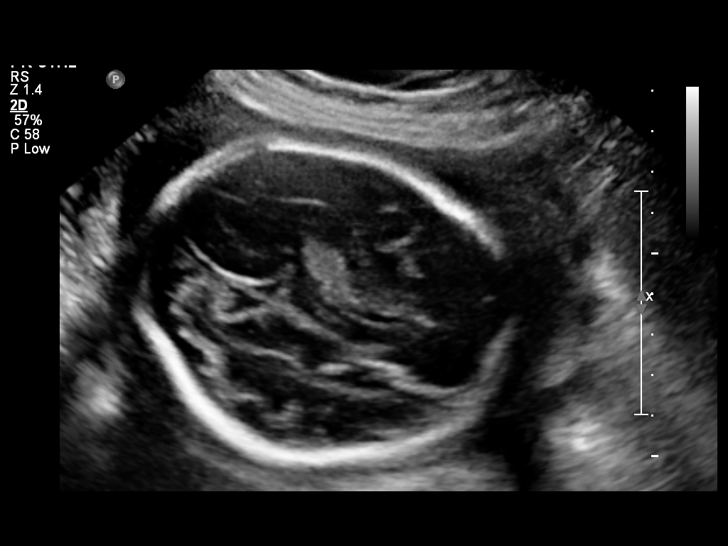
[im 34/77]
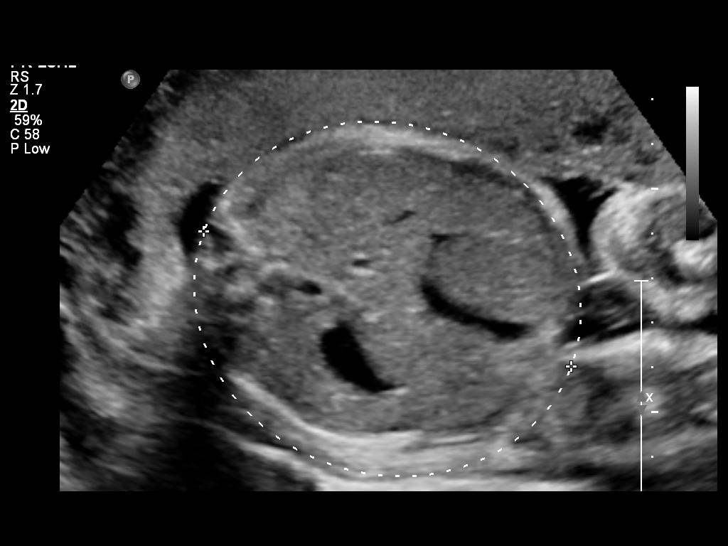
[im 43/77]
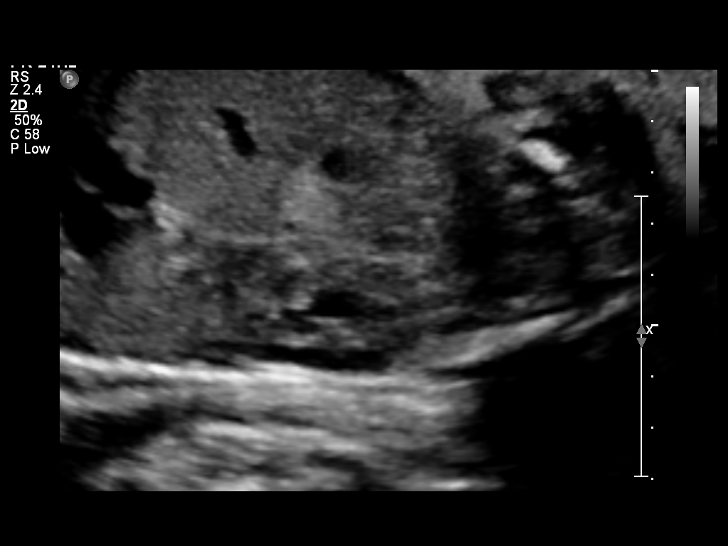
[im 48/77]
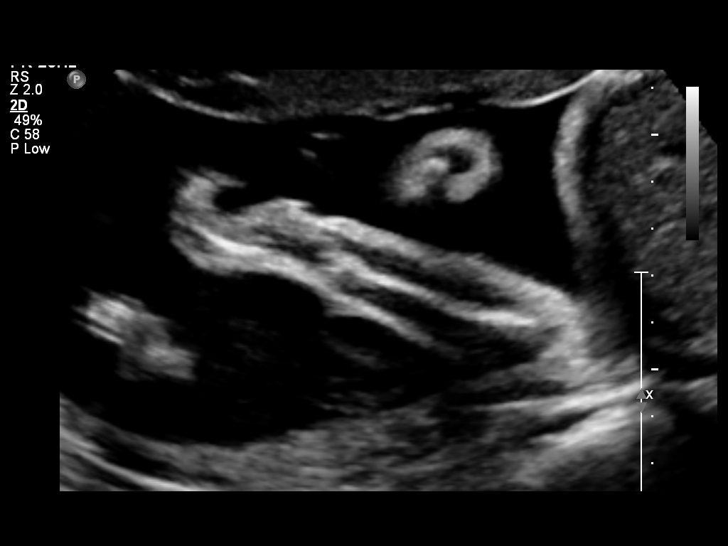
[im 54/77]
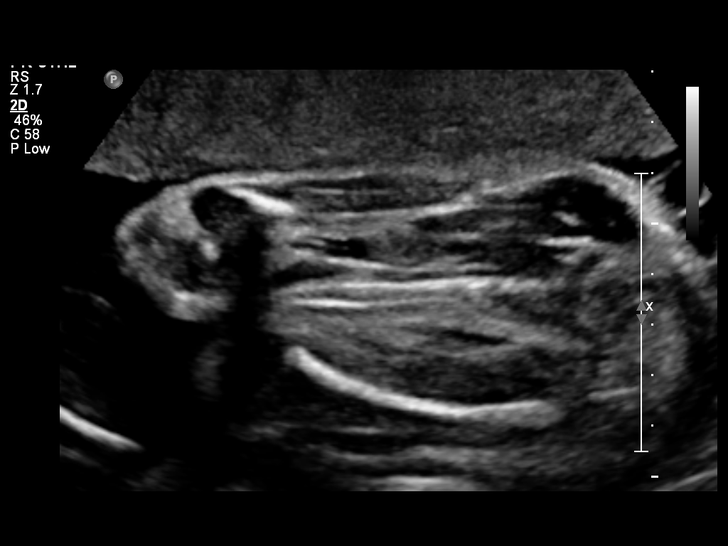
[im 62/77]
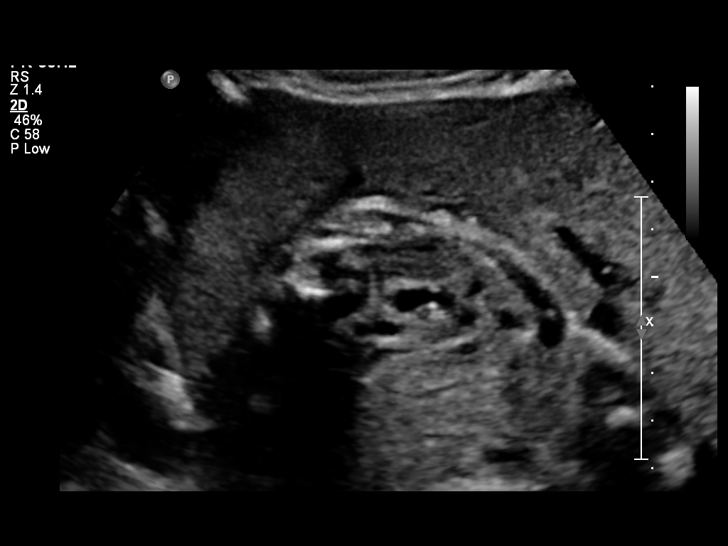
[im 68/77]
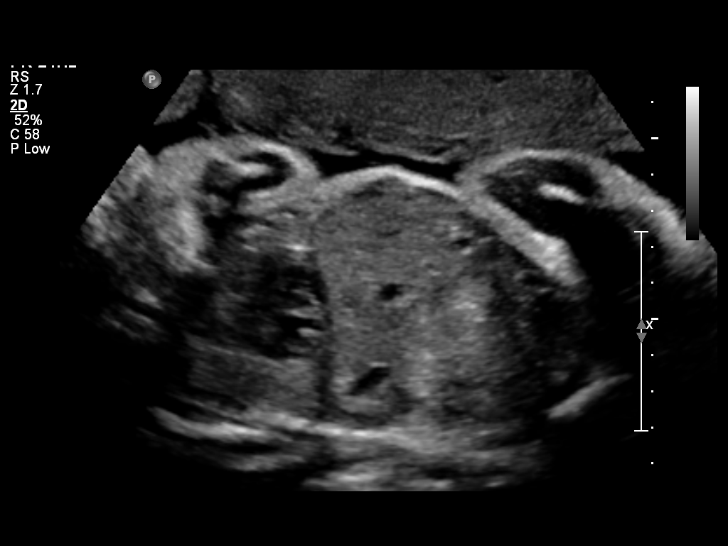
[im 74/77]
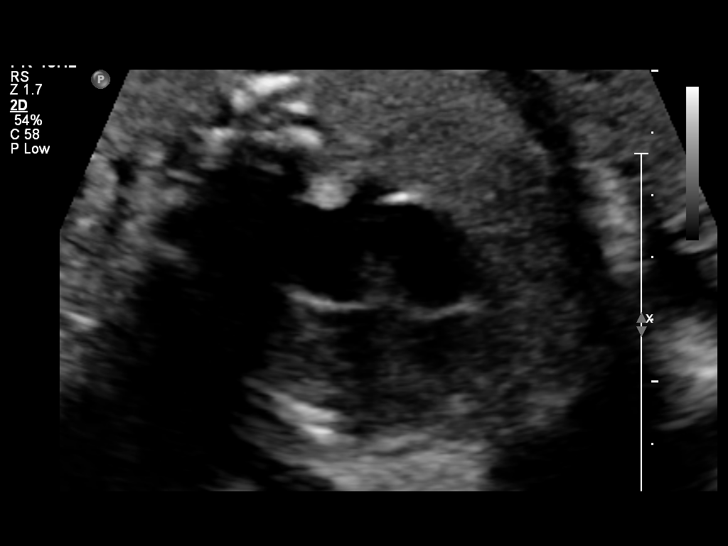

[12 of 28 positions shown; findings below may reference images not displayed]

OBSTETRICS REPORT
(Signed Final 05/16/2015 [DATE])

Service(s) Provided

Indications

28 weeks gestation of pregnancy
Vaginal bleeding in pregnancy, third trimester
Uterine scar, other than C/S (uterine septum
resection 5165)
Fetal Evaluation

Num Of Fetuses:    1
Fetal Heart Rate:  139                          bpm
Cardiac Activity:  Observed
Presentation:      Breech
Placenta:          Anterior, above cervical os
P. Cord            Visualized, central
Insertion:

Amniotic Fluid
AFI FV:      Subjectively within normal limits
AFI Sum:     17.91   cm       68  %Tile     Larg Pckt:    5.82  cm
RUQ:   5.54    cm   RLQ:    4.49   cm    LUQ:   5.82    cm   LLQ:    2.06   cm
Biometry

BPD:     76.1  mm     G. Age:  30w 4d                CI:         70.9   70 - 86
FL/HC:      19.4   18.8 -
20.6
HC:       288  mm     G. Age:  31w 5d     > 97  %    HC/AC:      1.11   1.05 -
1.21
AC:     258.9  mm     G. Age:  30w 1d       88  %    FL/BPD:     73.5   71 - 87
FL:      55.9  mm     G. Age:  29w 3d       68  %    FL/AC:      21.6   20 - 24
HUM:       52  mm     G. Age:  30w 2d       88  %
CER:       35  mm     G. Age:  30w 0d       80  %

Est. FW:    6744  gm      3 lb 5 oz     79  %
Gestational Age

LMP:           28w 2d        Date:  10/30/14                 EDD:   08/06/15
U/S Today:     30w 3d                                        EDD:   07/22/15
Best:          28w 2d     Det. By:  LMP  (10/30/14)          EDD:   08/06/15
Anatomy

Cranium:          Appears normal         Aortic Arch:      Not well visualized
Fetal Cavum:      Appears normal         Ductal Arch:      Not well visualized
Ventricles:       Appears normal         Diaphragm:        Appears normal
Choroid Plexus:   Appears normal         Stomach:          Appears normal, left
sided
Cerebellum:       Appears normal         Abdomen:          Appears normal
Posterior Fossa:  Appears normal         Abdominal Wall:   Not well visualized
Nuchal Fold:      Not applicable (>20    Cord Vessels:     Appears normal (3
wks GA)                                  vessel cord)
Face:             Appears normal         Kidneys:          Appear normal
(orbits and profile)
Lips:             Appears normal         Bladder:          Appears normal
Heart:            Appears normal         Spine:            Appears normal
(4CH, axis, and
situs)
RVOT:             Not well visualized    Lower             Visualized
Extremities:
LVOT:             Not well visualized    Upper             Visualized
Extremities:

Other:  Heels visualized. Technically difficult due to advanced GA and fetal
position.
Cervix Uterus Adnexa

Cervical Length:    4.6      cm

Cervix:       Normal appearance by transabdominal scan.

Left Ovary:    Within normal limits.
Right Ovary:   Within normal limits.
Adnexa:     No abnormality visualized.
Impression

SIUP at 25w2d in gestation complicated by prior notation of
bilateral fetal urinary tract dilation (formerly known as
pyelectasis) for evaluation by ultrasound in context of vaginal
bleeding
Active fetus in breech presentation
EFW 79th%'le, symmetric fetal growth pattern
Fetal kidneys are well-seen, noting normal AP diameter of
both renal pelves with normal renal parenchyma, size,
architecture and no abnormal calyceal dilation (resolved UTD)
Placenta implanted along the anterior uterine wall without
sonographic evidence of subchorionic or retroplacental
sonolucency (no abruption seen)
No previa
Cervix appears long and closed.
Recommendations

Follow up as clinically indicated.

## 2018-01-31 ENCOUNTER — Ambulatory Visit (INDEPENDENT_AMBULATORY_CARE_PROVIDER_SITE_OTHER): Payer: 59 | Admitting: Allergy and Immunology

## 2018-01-31 ENCOUNTER — Encounter: Payer: Self-pay | Admitting: Allergy and Immunology

## 2018-01-31 VITALS — BP 100/72 | HR 77 | Temp 97.6°F | Ht 66.0 in | Wt 126.8 lb

## 2018-01-31 DIAGNOSIS — T782XXD Anaphylactic shock, unspecified, subsequent encounter: Secondary | ICD-10-CM

## 2018-01-31 DIAGNOSIS — L500 Allergic urticaria: Secondary | ICD-10-CM | POA: Diagnosis not present

## 2018-01-31 DIAGNOSIS — T7840XD Allergy, unspecified, subsequent encounter: Secondary | ICD-10-CM

## 2018-01-31 DIAGNOSIS — T782XXA Anaphylactic shock, unspecified, initial encounter: Secondary | ICD-10-CM | POA: Insufficient documentation

## 2018-01-31 DIAGNOSIS — T7840XA Allergy, unspecified, initial encounter: Secondary | ICD-10-CM | POA: Insufficient documentation

## 2018-01-31 MED ORDER — EPINEPHRINE 0.3 MG/0.3ML IJ SOAJ: 0.3000 mg | Freq: Once | INTRAMUSCULAR | 2 refills | Status: AC

## 2018-01-31 NOTE — Assessment & Plan Note (Addendum)
The patients history suggests allergic reaction with an unclear trigger.  We were unable to perform skin test today because she is within the refractory period following anaphylaxis.    The following labs have been ordered: Baseline serum tryptase, CBC, CMP, ESR, ANA, and serum specific IgE against galactose-alpha-1,3-galactose panel, hymenoptera panel, and food allergen panel.   When lab results have returned the patient will be called with further recommendations and follow up instructions.  For now, avoid all mammalian meat.  Should symptoms recur, a journal is to be kept recording any foods eaten, beverages consumed, medications taken within a 6 hour period prior to the onset of symptoms, as well as activities performed, and environmental conditions. For any symptoms concerning for anaphylaxis, epinephrine is to be administered and 911 is to be called immediately.  A prescription has been provided for epinephrine autoinjector 2 pack (Auvi-Q) along with instructions for its proper administration.

## 2018-01-31 NOTE — Patient Instructions (Addendum)
Allergic reaction The patients history suggests allergic reaction with an unclear trigger.  We were unable to perform skin test today because she is within the refractory period following anaphylaxis.    The following labs have been ordered: Baseline serum tryptase, CBC, CMP, ESR, ANA, and serum specific IgE against galactose-alpha-1,3-galactose panel, hymenoptera panel, and food allergen panel.   When lab results have returned the patient will be called with further recommendations and follow up instructions.  For now, avoid all mammalian meat.  Should symptoms recur, a journal is to be kept recording any foods eaten, beverages consumed, medications taken within a 6 hour period prior to the onset of symptoms, as well as activities performed, and environmental conditions. For any symptoms concerning for anaphylaxis, epinephrine is to be administered and 911 is to be called immediately.  A prescription has been provided for epinephrine autoinjector 2 pack (Auvi-Q) along with instructions for its proper administration.    When lab results have returned the patient will be called with further recommendations and follow up instructions.  

## 2018-01-31 NOTE — Progress Notes (Signed)
New Patient Note  RE: Valerie Conley MRN: [id number] DOB: 29-Feb-1981 Date of Office Visit: 01/31/2018  Referring provider: No ref. provider found Primary care provider: Patient, No Pcp Per  Chief Complaint: No chief complaint on file.   History of present illness: Valerie Conley is a 37 y.o. female presenting today for evaluation of allergic reaction.  She reports that approximately 10 years ago she developed tongue swelling and the sensation that her throat was closing after consuming [place] food.  She is unable to recall details of what was and that meal.  She has not had recurrence of similar symptoms that episode until she had symptoms consistent with anaphylaxis this past Friday.  She states that the reaction occurred in the evening time.  She had been working in the garden earlier that day and had beef stew for dinner.  She put her children to bed began to feel nauseated.  Eventually she began "vomiting forever" and felt like she is can have diarrhea.  In addition, she felt like her face was swelling and that her head was going to "blow off".  In addition, he reports that it "felt like fire was coming out of (her) ears".  She took diphenhydramine and went to bed.  Approximately 2 hours she woke up with large plate like hives on her back and torso as well as the sensation that her throat was closing.  She was taken to the emergency department and treated with diphenhydramine and IM steroids.  She reports that she was bitten by tick approximately 3 weeks ago.  In addition, 2 or 3 weeks ago she was stung on the neck by some insect while outdoors.  She states that sting was not very painful but she developed a large hive on her neck where she had been stung.  She did not experience concomitant cardiopulmonary or GI symptoms at that time.  Assessment and plan: Allergic reaction The patients history suggests allergic reaction with an unclear trigger.  We were unable to perform skin  test today because she is within the refractory period following anaphylaxis.    The following labs have been ordered: Baseline serum tryptase, CBC, CMP, ESR, ANA, and serum specific IgE against galactose-alpha-1,3-galactose panel, hymenoptera panel, and food allergen panel.   When lab results have returned the patient will be called with further recommendations and follow up instructions.  For now, avoid all mammalian meat.  Should symptoms recur, a journal is to be kept recording any foods eaten, beverages consumed, medications taken within a 6 hour period prior to the onset of symptoms, as well as activities performed, and environmental conditions. For any symptoms concerning for anaphylaxis, epinephrine is to be administered and 911 is to be called immediately.  A prescription has been provided for epinephrine autoinjector 2 pack (Auvi-Q) along with instructions for its proper administration.   Meds ordered this encounter  Medications  . EPINEPHrine (AUVI-Q) 0.3 mg/0.3 mL IJ SOAJ injection    Sig: Inject 0.3 mLs (0.3 mg total) into the muscle once for 1 dose.    Dispense:  2 Device    Refill:  2    [phone] (H)    Diagnostics: Laboratory tests were drawn in the office.    Physical examination: Blood pressure 100/72, pulse 77, temperature 97.6 F (36.4 C), temperature source Oral, height 5' 6"  (1.676 m), weight 126 lb 12.8 oz (57.5 kg), last menstrual period 01/15/2018, SpO2 96 %, unknown if currently breastfeeding.  General: Alert, interactive, in  no acute distress. Neck: Supple without lymphadenopathy. Lungs: Clear to auscultation without wheezing, rhonchi or rales. CV: Normal S1, S2 without murmurs. Abdomen: Nondistended, nontender. Skin: Warm and dry, without lesions or rashes. Extremities:  No clubbing, cyanosis or edema. Neuro:   Grossly intact.  Review of systems:  Review of systems negative except as noted in HPI / PMHx or noted below: Review of Systems    Constitutional: Negative.   HENT: Negative.   Eyes: Negative.   Respiratory: Negative.   Cardiovascular: Negative.   Gastrointestinal: Negative.   Genitourinary: Negative.   Musculoskeletal: Negative.   Skin: Negative.   Neurological: Negative.   Endo/Heme/Allergies: Negative.   Psychiatric/Behavioral: Negative.     Past medical history:  Past Medical History:  Diagnosis Date  . History of miscarriage   . MTHFR deficiency complicating pregnancy ([place])   . MTHFR mutation ([place])   . Normal labor 09/23/2013  . Postpartum care following vaginal delivery (12/28) 09/23/2013    Past surgical history:  Past Surgical History:  Procedure Laterality Date  . DILATION AND CURETTAGE OF UTERUS  2012   with 2nd SAB  . UTERINE SEPTUM RESECTION N/A     Family history: Family History  Problem Relation Age of Onset  . Anesthesia problems Neg Hx   . Hypotension Neg Hx   . Malignant hyperthermia Neg Hx   . Pseudochol deficiency Neg Hx     Social history: Social History   Socioeconomic History  . Marital status: Married    Spouse name: Not on file  . Number of children: Not on file  . Years of education: Not on file  . Highest education level: Not on file  Occupational History  . Not on file  Social Needs  . Financial resource strain: Not on file  . Food insecurity:    Worry: Not on file    Inability: Not on file  . Transportation needs:    Medical: Not on file    Non-medical: Not on file  Tobacco Use  . Smoking status: Current Every Day Smoker    Packs/day: 0.50    Types: Cigarettes    Last attempt to quit: 07/30/2011    Years since quitting: 6.5  . Smokeless tobacco: Never Used  Substance and Sexual Activity  . Alcohol use: No  . Drug use: No  . Sexual activity: Yes    Birth control/protection: None  Lifestyle  . Physical activity:    Days per week: Not on file    Minutes per session: Not on file  . Stress: Not on file  Relationships  . Social connections:     Talks on phone: Not on file    Gets together: Not on file    Attends religious service: Not on file    Active member of club or organization: Not on file    Attends meetings of clubs or organizations: Not on file    Relationship status: Not on file  . Intimate partner violence:    Fear of current or ex partner: Not on file    Emotionally abused: Not on file    Physically abused: Not on file    Forced sexual activity: Not on file  Other Topics Concern  . Not on file  Social History Narrative  . Not on file   Environmental History: The patient lives in a 38 year old house with hardwood floors throughout and central air/heat.  There is no known mold/water damage in the home.  There are no pets in the  home.  She smokes less than 1 pack of cigarettes per day and started in 2018.  Allergies as of 01/31/2018      Reactions   Penicillins Nausea And Vomiting, Rash   Has patient had a PCN reaction causing immediate rash, facial/tongue/throat swelling, SOB or lightheadedness with hypotension: Yes Has patient had a PCN reaction causing severe rash involving mucus membranes or skin necrosis: No Has patient had a PCN reaction that required hospitalization No Has patient had a PCN reaction occurring within the last 10 years: No If all of the above answers are "NO", then may proceed with Cephalosporin use.      Medication List        Accurate as of 01/31/18  5:43 PM. Always use your most recent med list.          EPINEPHrine 0.3 mg/0.3 mL Soaj injection Commonly known as:  AUVI-Q Inject 0.3 mLs (0.3 mg total) into the muscle once for 1 dose.   folic acid 1 MG tablet Commonly known as:  FOLVITE Take 4 mg by mouth daily.   VITAMIN B-12 PO Take 1 tablet by mouth daily.       Known medication allergies: Allergies  Allergen Reactions  . Penicillins Nausea And Vomiting and Rash    Has patient had a PCN reaction causing immediate rash, facial/tongue/throat swelling, SOB or lightheadedness  with hypotension: Yes Has patient had a PCN reaction causing severe rash involving mucus membranes or skin necrosis: No Has patient had a PCN reaction that required hospitalization No Has patient had a PCN reaction occurring within the last 10 years: No If all of the above answers are "NO", then may proceed with Cephalosporin use.     I appreciate the opportunity to take part in [place] care. Please do not hesitate to contact me with questions.  Sincerely,   R. Edgar Frisk, MD

## 2018-02-08 LAB — FOOD ALLERGY PROFILE
Allergen Corn, IgE: 0.11 kU/L — AB
Clam IgE: <0.1 kU/L
Codfish IgE: <0.1 kU/L
Egg White IgE: 0.12 kU/L — AB
Milk IgE: 4.89 kU/L — AB
Peanut IgE: 0.13 kU/L — AB
Scallop IgE: <0.1 kU/L
Sesame Seed IgE: 0.23 kU/L — AB
Shrimp IgE: 0.1 kU/L — AB
Soybean IgE: <0.1 kU/L
Walnut IgE: <0.1 kU/L
Wheat IgE: 1.13 kU/L — AB

## 2018-02-08 LAB — ALLERGEN HYMENOPTERA PANEL
Bumblebee: 0.18 kU/L — AB
Honeybee IgE: 0.72 kU/L — AB
Hornet, White Face, IgE: 0.41 kU/L — AB
Hornet, Yellow, IgE: 0.24 kU/L — AB
Paper Wasp IgE: 0.25 kU/L — AB
Yellow Jacket, IgE: 0.41 kU/L — AB

## 2018-02-08 LAB — ALPHA-GAL PANEL
Alpha Gal IgE*: 66.3 kU/L — ABNORMAL HIGH (ref <0.10)
Beef (Bos spp) IgE: 33.4 kU/L — ABNORMAL HIGH (ref <0.35)
Class Interpretation: 3
Class Interpretation: 3
Class Interpretation: 4
Lamb/Mutton (Ovis spp) IgE: 6.3 kU/L — ABNORMAL HIGH (ref <0.35)
Pork (Sus spp) IgE: 15 kU/L — ABNORMAL HIGH (ref <0.35)

## 2018-02-08 LAB — COMPREHENSIVE METABOLIC PANEL
ALT: 15 IU/L (ref 0–32)
AST: 24 IU/L (ref 0–40)
Albumin/Globulin Ratio: 2.3 — ABNORMAL HIGH (ref 1.2–2.2)
Albumin: 4.9 g/dL (ref 3.5–5.5)
Alkaline Phosphatase: 41 IU/L (ref 39–117)
BUN/Creatinine Ratio: 23 (ref 9–23)
BUN: 16 mg/dL (ref 6–20)
Bilirubin Total: 0.6 mg/dL (ref 0.0–1.2)
CO2: 26 mmol/L (ref 20–29)
Calcium: 10 mg/dL (ref 8.7–10.2)
Chloride: 99 mmol/L (ref 96–106)
Creatinine, Ser: 0.71 mg/dL (ref 0.57–1.00)
GFR calc Af Amer: 126 mL/min/{1.73_m2} (ref >59)
GFR calc non Af Amer: 109 mL/min/{1.73_m2} (ref >59)
Globulin, Total: 2.1 g/dL (ref 1.5–4.5)
Glucose: 84 mg/dL (ref 65–99)
Potassium: 5 mmol/L (ref 3.5–5.2)
Sodium: 139 mmol/L (ref 134–144)
Total Protein: 7 g/dL (ref 6.0–8.5)

## 2018-02-08 LAB — CBC WITH DIFFERENTIAL/PLATELET
Basophils Absolute: 0.1 10*3/uL (ref 0.0–0.2)
Basos: 0 %
EOS (ABSOLUTE): 0.2 10*3/uL (ref 0.0–0.4)
Eos: 1 %
Hematocrit: 46.7 % — ABNORMAL HIGH (ref 34.0–46.6)
Hemoglobin: 15.3 g/dL (ref 11.1–15.9)
Immature Grans (Abs): 0 10*3/uL (ref 0.0–0.1)
Immature Granulocytes: 0 %
Lymphocytes Absolute: 1.6 10*3/uL (ref 0.7–3.1)
Lymphs: 12 %
MCH: 31.6 pg (ref 26.6–33.0)
MCHC: 32.8 g/dL (ref 31.5–35.7)
MCV: 97 fL (ref 79–97)
Monocytes Absolute: 0.7 10*3/uL (ref 0.1–0.9)
Monocytes: 5 %
Neutrophils Absolute: 10.4 10*3/uL — ABNORMAL HIGH (ref 1.4–7.0)
Neutrophils: 82 %
Platelets: 358 10*3/uL (ref 150–379)
RBC: 4.84 x10E6/uL (ref 3.77–5.28)
RDW: 13.1 % (ref 12.3–15.4)
WBC: 12.9 10*3/uL — ABNORMAL HIGH (ref 3.4–10.8)

## 2018-02-08 LAB — ANA W/REFLEX IF POSITIVE: Anti Nuclear Antibody(ANA): NEGATIVE

## 2018-02-08 LAB — SEDIMENTATION RATE: Sed Rate: 2 mm/hr (ref 0–32)

## 2018-02-08 LAB — IGE: IgE (Immunoglobulin E), Serum: 398 IU/mL (ref 6–495)

## 2018-02-08 LAB — TRYPTASE: Tryptase: 1.6 ug/L — ABNORMAL LOW (ref 2.2–13.2)
